# Patient Record
Sex: Female | Born: 1980 | Race: White | Hispanic: No | Marital: Married | Smoking: Former smoker
Health system: Southern US, Community
[De-identification: ages and names within clinical notes are randomized; demographics above are authoritative.]

## PROBLEM LIST (undated history)

## (undated) DIAGNOSIS — B1920 Unspecified viral hepatitis C without hepatic coma: Secondary | ICD-10-CM

## (undated) DIAGNOSIS — E119 Type 2 diabetes mellitus without complications: Secondary | ICD-10-CM

## (undated) DIAGNOSIS — E079 Disorder of thyroid, unspecified: Secondary | ICD-10-CM

## (undated) HISTORY — PX: TUBAL LIGATION: SHX77

---

## 2014-08-03 ENCOUNTER — Emergency Department: Payer: Self-pay | Admitting: Internal Medicine

## 2015-01-16 ENCOUNTER — Emergency Department: Payer: Self-pay | Admitting: Emergency Medicine

## 2015-07-27 ENCOUNTER — Encounter: Payer: Self-pay | Admitting: Emergency Medicine

## 2015-07-27 ENCOUNTER — Emergency Department
Admission: EM | Admit: 2015-07-27 | Discharge: 2015-07-28 | Discharge status: Against Medical Advice | Payer: Self-pay | Attending: Student | Admitting: Student

## 2015-07-27 DIAGNOSIS — Y9289 Other specified places as the place of occurrence of the external cause: Secondary | ICD-10-CM | POA: Insufficient documentation

## 2015-07-27 DIAGNOSIS — T465X2A Poisoning by other antihypertensive drugs, intentional self-harm, initial encounter: Secondary | ICD-10-CM | POA: Insufficient documentation

## 2015-07-27 DIAGNOSIS — Y9389 Activity, other specified: Secondary | ICD-10-CM | POA: Insufficient documentation

## 2015-07-27 DIAGNOSIS — E119 Type 2 diabetes mellitus without complications: Secondary | ICD-10-CM | POA: Insufficient documentation

## 2015-07-27 DIAGNOSIS — F322 Major depressive disorder, single episode, severe without psychotic features: Secondary | ICD-10-CM

## 2015-07-27 DIAGNOSIS — Y998 Other external cause status: Secondary | ICD-10-CM | POA: Insufficient documentation

## 2015-07-27 DIAGNOSIS — Z87891 Personal history of nicotine dependence: Secondary | ICD-10-CM | POA: Insufficient documentation

## 2015-07-27 HISTORY — DX: Unspecified viral hepatitis C without hepatic coma: B19.20

## 2015-07-27 HISTORY — DX: Disorder of thyroid, unspecified: E07.9

## 2015-07-27 HISTORY — DX: Type 2 diabetes mellitus without complications: E11.9

## 2015-07-27 LAB — COMPREHENSIVE METABOLIC PANEL
ALBUMIN: 3.9 g/dL (ref 3.5–5.0)
ALK PHOS: 48 U/L (ref 38–126)
ALT: 58 U/L — AB (ref 14–54)
AST: 67 U/L — AB (ref 15–41)
Anion gap: 7 (ref 5–15)
BILIRUBIN TOTAL: 0.6 mg/dL (ref 0.3–1.2)
BUN: 6 mg/dL (ref 6–20)
CO2: 26 mmol/L (ref 22–32)
CREATININE: 0.59 mg/dL (ref 0.44–1.00)
Calcium: 9.5 mg/dL (ref 8.9–10.3)
Chloride: 102 mmol/L (ref 101–111)
GFR calc Af Amer: >60 mL/min (ref >60)
GLUCOSE: 273 mg/dL — AB (ref 65–99)
Potassium: 4.1 mmol/L (ref 3.5–5.1)
Sodium: 135 mmol/L (ref 135–145)
TOTAL PROTEIN: 8.4 g/dL — AB (ref 6.5–8.1)

## 2015-07-27 LAB — CBC
HEMATOCRIT: 36.1 % (ref 35.0–47.0)
Hemoglobin: 11.8 g/dL — ABNORMAL LOW (ref 12.0–16.0)
MCH: 28.2 pg (ref 26.0–34.0)
MCHC: 32.7 g/dL (ref 32.0–36.0)
MCV: 86.2 fL (ref 80.0–100.0)
PLATELETS: 246 10*3/uL (ref 150–440)
RBC: 4.19 MIL/uL (ref 3.80–5.20)
RDW: 15.7 % — AB (ref 11.5–14.5)
WBC: 11.1 10*3/uL — ABNORMAL HIGH (ref 3.6–11.0)

## 2015-07-27 LAB — SALICYLATE LEVEL: Salicylate Lvl: <4 mg/dL (ref 2.8–30.0)

## 2015-07-27 LAB — T4, FREE: Free T4: 0.76 ng/dL (ref 0.61–1.12)

## 2015-07-27 LAB — ETHANOL

## 2015-07-27 LAB — ACETAMINOPHEN LEVEL: Acetaminophen (Tylenol), Serum: <10 ug/mL — ABNORMAL LOW (ref 10–30)

## 2015-07-27 LAB — TSH: TSH: 21.755 u[IU]/mL — ABNORMAL HIGH (ref 0.350–4.500)

## 2015-07-27 MED ORDER — GLIPIZIDE ER 10 MG PO TB24: 10.0000 mg | ORAL_TABLET | Freq: Every day | ORAL | Status: DC

## 2015-07-27 MED ORDER — LEVOTHYROXINE SODIUM 25 MCG PO TABS: 25.0000 ug | ORAL_TABLET | Freq: Every day | ORAL | Status: DC

## 2015-07-27 NOTE — ED Notes (Signed)
Patient awake and wants to speak to her "F**king kids." Explained to patient she is unable to use the phones at this time and that she is currently under IVC commitment (wants to sign herself out). Patient pulling medical equipment off as she is cussing about her IVC status and that "we IVC'd her cousin but she still killed herself." Patient redirected to herself and her surroundings. Will notify MD of patient's wishes to sign herself out.

## 2015-07-27 NOTE — ED Notes (Signed)
Patient ambulatory to the bathroom with assistance

## 2015-07-27 NOTE — ED Notes (Signed)
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
## 2015-07-27 NOTE — ED Notes (Signed)
Pt family left pt bedside

## 2015-07-27 NOTE — ED Notes (Signed)
Family at bedside. 
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
## 2015-07-27 NOTE — ED Provider Notes (Addendum)
   Report of charge, the patient intentionally attempted overdose on clonidine today. The patient was evaluated at triage and she is attempting to leave, based on a clear report of suicidal ideation and attempt per the charge nurse I have filed for commitment papers for the patient's safety.  Sharyn Creamer, MD 07/27/15 1350  Sharyn Creamer, MD 07/27/15 1352
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
## 2015-07-27 NOTE — ED Notes (Signed)
Counselor at pt bedside

## 2015-07-27 NOTE — ED Notes (Signed)
BEHAVIORAL HEALTH ROUNDING Patient sleeping: Yes.   Patient alert and oriented: yes Behavior appropriate: Yes.  ; If no, describe:  Nutrition and fluids offered: {YES Toileting and hygiene offered: {No Sitter present: {YES Law enforcement present: {YES

## 2015-07-27 NOTE — ED Notes (Signed)
Pt in via [facility] with c/o overdose. EMS reports per pt, she took 20 0.1mg  Clonodine tablets that were not prescribed to her. Pt reports is having suicidal thoughts. Pt reports she took a total of  of the tablets. Pt states, "I was just trying to sleep my life away".

## 2015-07-27 NOTE — ED Notes (Signed)
Pt denies SI currently. Pt reports taking 20, 0.1 mg clonidine

## 2015-07-28 ENCOUNTER — Inpatient Hospital Stay
Admission: RE | Admit: 2015-07-28 | Discharge: 2015-07-30 | DRG: 885 | Disposition: A | Discharge status: Home / Self Care | Payer: No Typology Code available for payment source | Source: Intra-hospital | Attending: Psychiatry | Admitting: Psychiatry

## 2015-07-28 DIAGNOSIS — F1220 Cannabis dependence, uncomplicated: Secondary | ICD-10-CM | POA: Diagnosis present

## 2015-07-28 DIAGNOSIS — E119 Type 2 diabetes mellitus without complications: Secondary | ICD-10-CM

## 2015-07-28 DIAGNOSIS — Z62810 Personal history of physical and sexual abuse in childhood: Secondary | ICD-10-CM | POA: Diagnosis present

## 2015-07-28 DIAGNOSIS — F1190 Opioid use, unspecified, uncomplicated: Secondary | ICD-10-CM | POA: Diagnosis present

## 2015-07-28 DIAGNOSIS — T50902A Poisoning by unspecified drugs, medicaments and biological substances, intentional self-harm, initial encounter: Secondary | ICD-10-CM | POA: Diagnosis present

## 2015-07-28 DIAGNOSIS — B1920 Unspecified viral hepatitis C without hepatic coma: Secondary | ICD-10-CM

## 2015-07-28 DIAGNOSIS — Z818 Family history of other mental and behavioral disorders: Secondary | ICD-10-CM | POA: Diagnosis not present

## 2015-07-28 DIAGNOSIS — F1121 Opioid dependence, in remission: Secondary | ICD-10-CM

## 2015-07-28 DIAGNOSIS — F322 Major depressive disorder, single episode, severe without psychotic features: Principal | ICD-10-CM

## 2015-07-28 DIAGNOSIS — E559 Vitamin D deficiency, unspecified: Secondary | ICD-10-CM | POA: Diagnosis present

## 2015-07-28 DIAGNOSIS — Z87891 Personal history of nicotine dependence: Secondary | ICD-10-CM

## 2015-07-28 DIAGNOSIS — F1120 Opioid dependence, uncomplicated: Secondary | ICD-10-CM | POA: Diagnosis present

## 2015-07-28 DIAGNOSIS — G4700 Insomnia, unspecified: Secondary | ICD-10-CM | POA: Diagnosis present

## 2015-07-28 DIAGNOSIS — Z79899 Other long term (current) drug therapy: Secondary | ICD-10-CM | POA: Diagnosis not present

## 2015-07-28 DIAGNOSIS — E039 Hypothyroidism, unspecified: Secondary | ICD-10-CM | POA: Diagnosis present

## 2015-07-28 LAB — GLUCOSE, CAPILLARY
GLUCOSE-CAPILLARY: 285 mg/dL — AB (ref 65–99)
GLUCOSE-CAPILLARY: 248 mg/dL — AB (ref 65–99)
Glucose-Capillary: 289 mg/dL — ABNORMAL HIGH (ref 65–99)

## 2015-07-28 LAB — HEMOGLOBIN A1C: HEMOGLOBIN A1C: 8.8 % — AB (ref 4.0–6.0)

## 2015-07-28 MED ORDER — IBUPROFEN 600 MG PO TABS
600.0000 mg | ORAL_TABLET | Freq: Four times a day (QID) | ORAL | Status: DC | PRN
  Administered 2015-07-28 – 2015-07-29 (×2): 600 mg via ORAL
  Filled 2015-07-28 (×2): qty 1

## 2015-07-28 MED ORDER — ALUM & MAG HYDROXIDE-SIMETH 200-200-20 MG/5ML PO SUSP: 30.0000 mL | ORAL | Status: DC | PRN

## 2015-07-28 MED ORDER — VENLAFAXINE HCL ER 37.5 MG PO CP24: 37.5000 mg | ORAL_CAPSULE | Freq: Every day | ORAL | Status: DC

## 2015-07-28 MED ORDER — METFORMIN HCL 500 MG PO TABS
500.0000 mg | ORAL_TABLET | Freq: Two times a day (BID) | ORAL | Status: DC
  Administered 2015-07-28 – 2015-07-30 (×4): 500 mg via ORAL
  Filled 2015-07-28 (×4): qty 1

## 2015-07-28 MED ORDER — ACETAMINOPHEN 325 MG PO TABS: 650.0000 mg | ORAL_TABLET | Freq: Four times a day (QID) | ORAL | Status: DC | PRN

## 2015-07-28 MED ORDER — HYDROXYZINE HCL 50 MG PO TABS: 50.0000 mg | ORAL_TABLET | Freq: Three times a day (TID) | ORAL | Status: DC | PRN

## 2015-07-28 MED ORDER — INSULIN ASPART 100 UNIT/ML ~~LOC~~ SOLN
0.0000 [IU] | Freq: Three times a day (TID) | SUBCUTANEOUS | Status: DC
  Administered 2015-07-29 (×2): 1 [IU] via SUBCUTANEOUS
  Administered 2015-07-29: 2 [IU] via SUBCUTANEOUS
  Filled 2015-07-28 (×2): qty 1
  Filled 2015-07-28 (×2): qty 2

## 2015-07-28 MED ORDER — VENLAFAXINE HCL ER 37.5 MG PO CP24
37.5000 mg | ORAL_CAPSULE | Freq: Every day | ORAL | Status: DC
  Administered 2015-07-28 – 2015-07-30 (×3): 37.5 mg via ORAL
  Filled 2015-07-28 (×3): qty 1

## 2015-07-28 MED ORDER — MAGNESIUM HYDROXIDE 400 MG/5ML PO SUSP: 30.0000 mL | Freq: Every day | ORAL | Status: DC | PRN

## 2015-07-28 MED ORDER — ONDANSETRON HCL 4 MG/2ML IJ SOLN
INTRAMUSCULAR | Status: AC
  Filled 2015-07-28: qty 2

## 2015-07-28 MED ORDER — LEVOTHYROXINE SODIUM 50 MCG PO TABS
25.0000 ug | ORAL_TABLET | Freq: Every day | ORAL | Status: DC
  Administered 2015-07-28 – 2015-07-30 (×3): 25 ug via ORAL
  Filled 2015-07-28 (×4): qty 1

## 2015-07-28 MED ORDER — LEVOTHYROXINE SODIUM 50 MCG PO TABS: 25.0000 ug | ORAL_TABLET | Freq: Every day | ORAL | Status: DC

## 2015-07-28 MED ORDER — VITAMIN D 1000 UNITS PO TABS
2000.0000 [IU] | ORAL_TABLET | Freq: Every day | ORAL | Status: DC
  Administered 2015-07-28 – 2015-07-30 (×3): 2000 [IU] via ORAL
  Filled 2015-07-28 (×3): qty 2

## 2015-07-28 MED ORDER — TRAZODONE HCL 100 MG PO TABS: 100.0000 mg | ORAL_TABLET | Freq: Every evening | ORAL | Status: DC | PRN

## 2015-07-28 MED ORDER — GLIPIZIDE ER 5 MG PO TB24
10.0000 mg | ORAL_TABLET | Freq: Every day | ORAL | Status: DC
  Administered 2015-07-28 – 2015-07-30 (×3): 10 mg via ORAL
  Filled 2015-07-28 (×3): qty 2

## 2015-07-28 MED ORDER — VITAMIN D3 50 MCG (2000 UT) PO CAPS: 1.0000 | ORAL_CAPSULE | Freq: Every day | ORAL | Status: DC

## 2015-07-28 MED ORDER — MORPHINE SULFATE (PF) 4 MG/ML IV SOLN
INTRAVENOUS | Status: AC
  Filled 2015-07-28: qty 1

## 2015-07-28 NOTE — Progress Notes (Signed)
Recreation Therapy Notes  Date: 09.27.16 Time: 3:00 pm Location: Craft Room  Group Topic: Goal Setting  Goal Area(s) Addresses:  Patient will write at least one goal. Patient will write at least one obstacle.  Behavioral Response: Did not attend  Intervention: Recovery Goal Chart  Activity: Patients were instructed to make a goal chart listing goals, obstacles, the date they started working on their goals, and the date they have achieved their goals.   Education: LRT educated patients on healthy ways to celebrate reaching their goals.  Education Outcome: Patient did not attend group.  Clinical Observations/Feedback: Patient did not attend group.   Greene,Elizabeth M, LRT/CTRS 07/28/2015 4:19 PM 

## 2015-07-28 NOTE — Tx Team (Signed)
Initial Interdisciplinary Treatment Plan   PATIENT STRESSORS: Loss of Cousin to Suicide Traumatic event   PATIENT STRENGTHS: Ability for insight Average or above average intelligence General fund of knowledge Motivation for treatment/growth Religious Affiliation Supportive family/friends   PROBLEM LIST: Problem List/Patient Goals Date to be addressed Date deferred Reason deferred Estimated date of resolution  Depression 07/28/15     Suicidal 07/28/15                                                DISCHARGE CRITERIA:  Improved stabilization in mood, thinking, and/or behavior  PRELIMINARY DISCHARGE PLAN: Outpatient therapy  PATIENT/FAMIILY INVOLVEMENT: This treatment plan has been presented to and reviewed with the patient, Stephanie Barry, and/or family member.  The patient and family have been given the opportunity to ask questions and make suggestions.  Gretta Arab [facility] 07/28/2015, 3:40 AM

## 2015-07-28 NOTE — Progress Notes (Signed)
Patient cooperative with morning assessment. Denies suicidal ideation and agreed to contract for safety. Denies hopelessness. Patient reported stressors to include family (aunt, parents), suicide of cousin, husband away for work and 5 children. Reported the need to find a new place to live because she currently lives with her children in her parent's home. Patient did not go to dayroom for meals and stated she "feels the need to sleep."  She did not have a goal for the day and did not want to attend groups at this time. Writer encouraged participation in unit programming.

## 2015-07-28 NOTE — [facility] Group Notes (Signed)
[facility] Group Notes:  (Nursing/MHT/Case Management/Adjunct)  Date:  07/28/2015  Time:  2:22 PM  Type of Therapy:  Psychoeducational Skills  Participation Level:  Did Not Attend   Lynelle Smoke [facility] 07/28/2015, 2:22 PM

## 2015-07-28 NOTE — ED Notes (Signed)
Patient sitting in floor, asleep with her head against the wall. MD notified of patient behavior.

## 2015-07-28 NOTE — ED Notes (Signed)
BEHAVIORAL HEALTH ROUNDING Patient sleeping: No. Patient alert and oriented: yes Behavior appropriate: Yes.  ; If no, describe:  Nutrition and fluids offered: {No  Toileting and hygiene offered: {YES Sitter present: {YES Law enforcement present: {YES

## 2015-07-28 NOTE — Progress Notes (Signed)
Recreation Therapy Notes  INPATIENT RECREATION THERAPY ASSESSMENT  Patient Details Name: Stephanie Barry MRN: [id number] DOB: Jan 09, 1981 Today's Date: 07/28/2015  Patient Stressors: Family, Death, Other (Comment) (Mom yells; cousin died; kids not listening)  Coping Skills:   Isolate, Substance Abuse, Avoidance, Music, Sports  Personal Challenges: Anger, Communication, Self-Esteem/Confidence, Social Interaction, Time Management, Trusting Others  Leisure Interests (2+):  Individual - Other (Comment) (Play with kids, hang out with mom)  Awareness of Community Resources:  No  Community Resources:     Current Use:    If no, Barriers?:    Patient Strengths:  Good heart, kind  Patient Identified Areas of Improvement:  Self-esteem  Current Recreation Participation:  Nothing  Patient Goal for Hospitalization:  To get better for her kids  [place] of Residence:  [place] of Residence:  [place]   Current SI (including self-harm):  No  Current HI:  No  Consent to Intern Participation: N/A   Jacquelynn Cree, LRT/CTRS 07/28/2015, 6:11 PM

## 2015-07-28 NOTE — ED Notes (Signed)
BEHAVIORAL HEALTH ROUNDING Patient sleeping: Yes.   Patient alert and oriented: yes Behavior appropriate: Yes.  ; If no, describe:  Nutrition and fluids offered: No Toileting and hygiene offered: {N Sitter present: {YES Law enforcement present: {YES

## 2015-07-28 NOTE — ED Notes (Signed)
Patient continues to sleep while sitting on the floor.  Patient does not wish to get in the bed.

## 2015-07-28 NOTE — ED Notes (Signed)
Patient now asleep in the bed. Will continue to monitor
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
## 2015-07-28 NOTE — ED Notes (Signed)
Will seek order to move to ED BHU.

## 2015-07-28 NOTE — ED Notes (Signed)
Patient will orally hydrate per MD. Patient aware.
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
## 2015-07-28 NOTE — Plan of Care (Signed)
Problem: Alteration in mood Goal: STG-Patient is able to discuss feelings and issues (Patient is able to discuss feelings and issues leading to depression)  Outcome: Progressing Patient able to identify stressors and contemplating change.
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
## 2015-07-28 NOTE — ED Notes (Signed)
When patient arrived at the [facility] in [place] she began to "feel faint" and proceeded to sit down in the floor. Brought back over to room 20. Will check FSBS and notify MD

## 2015-07-28 NOTE — Progress Notes (Signed)
Patient had depressed, sad mood. Became teary eyed when speaking about missing her children. Patient ate lunch and dinner in the dayroom. She not attend any group meetings and stated she "wasnt ready yet."  Patient remains pleasant and cooperative. Complaint with medications.

## 2015-07-29 LAB — THYROID PANEL WITH TSH
Free Thyroxine Index: 2.3 (ref 1.2–4.9)
T3 Uptake Ratio: 23 % — ABNORMAL LOW (ref 24–39)
T4, Total: 10.1 ug/dL (ref 4.5–12.0)
TSH: 8.15 u[IU]/mL — ABNORMAL HIGH (ref 0.450–4.500)

## 2015-07-29 LAB — GLUCOSE, CAPILLARY
GLUCOSE-CAPILLARY: 179 mg/dL — AB (ref 65–99)
Glucose-Capillary: 126 mg/dL — ABNORMAL HIGH (ref 65–99)
Glucose-Capillary: 132 mg/dL — ABNORMAL HIGH (ref 65–99)

## 2015-07-29 MED ORDER — TRAZODONE HCL 100 MG PO TABS: 100.0000 mg | ORAL_TABLET | Freq: Every evening | ORAL | Status: DC | PRN

## 2015-07-29 MED ORDER — METFORMIN HCL 500 MG PO TABS: 500.0000 mg | ORAL_TABLET | Freq: Two times a day (BID) | ORAL | Status: DC

## 2015-07-29 MED ORDER — VENLAFAXINE HCL ER 75 MG PO CP24: 75.0000 mg | ORAL_CAPSULE | Freq: Every day | ORAL | Status: DC

## 2015-07-29 NOTE — [facility] Group Notes (Signed)
[facility] Group Notes:  (Nursing/MHT/Case Management/Adjunct)  Date:  07/29/2015  Time:  1:45 PM  Type of Therapy:  Psychoeducational Skills  Participation Level:  Active  Participation Quality:  Appropriate and Supportive  Affect:  Appropriate  Cognitive:  Appropriate  Insight:  Appropriate  Engagement in Group:  Supportive  Modes of Intervention:  Clarification  Summary of Progress/Problems:  Stephanie Barry 07/29/2015, 1:45 PM

## 2015-07-29 NOTE — Progress Notes (Signed)
Recreation Therapy Notes  Date: 09.28.16 Time: 3:00 pm Location: Day Room  Group Topic: Self-esteem  Goal Area(s) Addresses:  Patient will write at least one positive trait. Patient will verbalize benefit of having a good self-esteem.  Behavioral Response: Attentive, Interactive  Intervention: I Am  Activity: Patients were given a worksheet with the letter I on it and instructed to fill the letter with positive traits about themselves.  Education:LRT educated patients on ways they can increase their self-esteem.  Education Outcome: Acknowledges education/In group clarification offered   Clinical Observations/Feedback: Patient completed activity by writing positive traits down. Patient contributed to group discussion by stating it was difficult to think of positive traits, how it felt to see positive traits written out, and how her self-esteem affects her.  Jacquelynn Cree, LRT/CTRS 07/29/2015 4:46 PM

## 2015-07-29 NOTE — Plan of Care (Signed)
Problem: [facility] Participation in Recreation Therapeutic Interventions Goal: STG-Patient will demonstrate improved self esteem by identif STG: Self-Esteem - Within 3 treatment sessions, patient will verbalize at least 5 positive affirmation statements in one treatment session to increase self-esteem post d/c.  Outcome: Completed/Met Date Met:  07/29/15 Treatment Session 1; Completed 1 out of 1: At approximately 12:20 pm, LRT met with patient in craft room. Patient verbalized 5 positive affirmation statements. Patient reported it felt "pretty good". LRT encouraged patient to continue saying positive affirmation statements.  Leonette Monarch, LRT/CTRS 09.28.16 1:42 pm Goal: STG-Other Recreation Therapy Goal (Specify) STG: Time Management - Within 3 treatment sessions, patient will verbalize understanding of scheduling in one treatment session to increase time management skills post d/c.  Outcome: Completed/Met Date Met:  07/29/15 Treatment Session 1; Completed 1 out of 1: At approximately 12:20 pm, LRT met with patient in craft room. LRT provided patient with blank schedules. LRT educated patient on scheduling. Patient verbalized understanding of scheduling. LRT encouraged patient to use schedules to help her with time management.  Leonette Monarch, LRT/CTRS 09.28.16 1:45 pm

## 2015-07-29 NOTE — Progress Notes (Signed)
[facility] MD Progress Note  07/29/2015 10:51 AM Stephanie Barry  MRN:  [id number] Subjective: Patient reports feeling much improved. She feels less depressed, more hopeful and future oriented. She denies problems with the medications that were started yesterday. She denies having any physical complaints. Denies having major problems with sleep, appetite energy or concentration. Denies SI, HI hopelessness, helplessness or worthlessness. Denies psychosis or hallucinations.  Patient has been attending group and has been compliant with treatment. Patient states she is looking forward to be discharged in order to return to take care of her children. Patient tells me once discharged she will plan to return to live with her mother. However she will apply for low income housing and move out as soon as possible. She is states that her aunt is no longer trying to evict her.  Per nursing: Patient cooperative with morning assessment. Denies suicidal ideation and agreed to contract for safety. Denies hopelessness. Patient reported stressors to include family (aunt, parents), suicide of cousin, husband away for work and 5 children. Reported the need to find a new place to live because she currently lives with her children in her parent's home. Patient did not go to dayroom for meals and stated she "feels the need to sleep." She did not have a goal for the day and did not want to attend groups at this time. Writer encouraged participation in unit programming.   Principal Problem: Major depressive disorder, single episode, severe without psychotic features Diagnosis:   Patient Active Problem List   Diagnosis Date Noted  . Hypothyroidism [E03.9] 07/28/2015  . Cannabis use disorder, moderate, dependence [F12.20] 07/28/2015  . Diabetes [E11.9] 07/28/2015  . Hepatitis C [B19.20] 07/28/2015  . Major depressive disorder, single episode, severe without psychotic features [F32.2] 07/28/2015  . Opioid use disorder, severe, in early  remission [F11.90] 07/28/2015   Total Time spent with patient: 30 minutes  Past Psychiatric History: Past psychiatric history is positive for depression but no suicidal behavior in the past. No previous psychiatric hospitalizations. She is currently taking medicine prescribed by an outpatient provider at [facility]. No history of psychotic symptoms. Patient received Suboxone from a physician in [place]. She weaned herself off the Suboxone several months ago.  Risk to Self: Is patient at risk for suicide?: Yes Risk to Others:   Prior Inpatient Therapy:   Prior Outpatient Therapy:    Alcohol Screening: 1. How often do you have a drink containing alcohol?: Never 9. Have you or someone else been injured as a result of your drinking?: No 10. Has a relative or friend or a doctor or another health worker been concerned about your drinking or suggested you cut down?: No Alcohol Use Disorder Identification Test Final Score (AUDIT): 0 Brief Intervention: AUDIT score less than 7 or less-screening does not suggest unhealthy drinking-brief intervention not indicated   Past Medical History: Multiple medical problems including diabetes, hypothyroidism and hepatitis C and anemia Past Medical History  Diagnosis Date  . Diabetes mellitus without complication   . Thyroid disease   . Hepatitis C    History reviewed. No pertinent past surgical history.  Family History: History reviewed. No pertinent family history.   Family Psychiatric History: Positive for a cousin who committed suicide and several other family members with depression. 2 of her children suffer from ADHD.  Social History: Lives with her mother and also her own 5 children (younguest is almost 2 and the oldest is 9. She works as a [employer] fast streamer. Irregular hours. Her fianc  lives in another city but is probably going to move here soon. Patient is originally from [place] that she has been living in [place] since  January. History  Alcohol Use No    History  Drug Use  . Yes  . Special: Marijuana    Social History   Social History  . Marital Status: Single    Spouse Name: N/A  . Number of Children: N/A  . Years of Education: N/A   Social History Main Topics  . Smoking status: Former Research scientist (life sciences)  . Smokeless tobacco: None  . Alcohol Use: No  . Drug Use: Yes    Special: Marijuana  . Sexual Activity: Not Asked        Sleep: Good  Appetite:  Good  Current Medications: Current Facility-Administered Medications  Medication Dose Route Frequency Provider Last Rate Last Dose  . acetaminophen (TYLENOL) tablet 650 mg  650 mg Oral Q6H PRN Gonzella Lex, MD      . alum & mag hydroxide-simeth (MAALOX/MYLANTA) 200-200-20 MG/5ML suspension 30 mL  30 mL Oral Q4H PRN Gonzella Lex, MD      . cholecalciferol (VITAMIN D) tablet 2,000 Units  2,000 Units Oral Daily Hildred Priest, MD   2,000 Units at 07/29/15 0901  . glipiZIDE (GLUCOTROL XL) 24 hr tablet 10 mg  10 mg Oral Q breakfast Gonzella Lex, MD   10 mg at 07/29/15 0901  . hydrOXYzine (ATARAX/VISTARIL) tablet 50 mg  50 mg Oral TID PRN Hildred Priest, MD      . ibuprofen (ADVIL,MOTRIN) tablet 600 mg  600 mg Oral Q6H PRN Hildred Priest, MD   600 mg at 07/29/15 0955  . insulin aspart (novoLOG) injection 0-9 Units  0-9 Units Subcutaneous TID WC Hildred Priest, MD   2 Units at 07/29/15 [phone]  . levothyroxine (SYNTHROID, LEVOTHROID) tablet 25 mcg  25 mcg Oral QAC breakfast Gonzella Lex, MD   25 mcg at 07/29/15 0729  . magnesium hydroxide (MILK OF MAGNESIA) suspension 30 mL  30 mL Oral Daily PRN Gonzella Lex, MD      . metFORMIN (GLUCOPHAGE) tablet 500 mg  500 mg Oral BID WC Hildred Priest, MD   500 mg at 07/29/15 0901  . traZODone (DESYREL) tablet 100 mg  100 mg Oral QHS PRN Hildred Priest, MD      . venlafaxine XR (EFFEXOR-XR) 24 hr  capsule 37.5 mg  37.5 mg Oral Q breakfast Hildred Priest, MD   37.5 mg at 07/29/15 0901    Lab Results:  Results for orders placed or performed during the hospital encounter of 07/28/15 (from the past 48 hour(s))  Glucose, capillary     Status: Abnormal   Collection Time: 07/28/15  6:52 AM  Result Value Ref Range   Glucose-Capillary 285 (H) 65 - 99 mg/dL   Comment 1 Notify RN    Comment 2 Document in Chart   Thyroid Panel With TSH     Status: Abnormal   Collection Time: 07/28/15 11:57 AM  Result Value Ref Range   TSH 8.150 (H) 0.450 - 4.500 uIU/mL   T4, Total 10.1 4.5 - 12.0 ug/dL   T3 Uptake Ratio 23 (L) 24 - 39 %   Free Thyroxine Index 2.3 1.2 - 4.9    Comment: (NOTE) Performed At: [facility] [address] [place], [place] [id number] Lindon Romp MD DJ:[id number]   Hemoglobin A1c     Status: Abnormal   Collection Time: 07/28/15 12:03 PM  Result Value  Ref Range   Hgb A1c MFr Bld 8.8 (H) 4.0 - 6.0 %  Glucose, capillary     Status: Abnormal   Collection Time: 07/28/15  4:47 PM  Result Value Ref Range   Glucose-Capillary 248 (H) 65 - 99 mg/dL   Comment 1 Notify RN   Glucose, capillary     Status: Abnormal   Collection Time: 07/29/15  6:48 AM  Result Value Ref Range   Glucose-Capillary 179 (H) 65 - 99 mg/dL    Physical Findings: AIMS: Facial and Oral Movements Muscles of Facial Expression: None, normal Lips and Perioral Area: None, normal Jaw: None, normal Tongue: None, normal,Extremity Movements Upper (arms, wrists, hands, fingers): None, normal Lower (legs, knees, ankles, toes): None, normal, Trunk Movements Neck, shoulders, hips: None, normal, Overall Severity Severity of abnormal movements (highest score from questions above): None, normal Incapacitation due to abnormal movements: None, normal Patient's awareness of abnormal movements (rate only patient's report): No Awareness, Dental Status Current problems with teeth and/or dentures?:  No Does patient usually wear dentures?: No  CIWA:    COWS:     Musculoskeletal: Strength & Muscle Tone: within normal limits Gait & Station: normal Patient leans: N/A  Psychiatric Specialty Exam: Review of Systems  Constitutional: Negative.   HENT: Negative.   Eyes: Negative.   Respiratory: Negative.   Cardiovascular: Negative.   Gastrointestinal: Negative.   Genitourinary: Negative.   Musculoskeletal: Negative.   Skin: Negative.   Neurological: Negative.   Endo/Heme/Allergies: Negative.   Psychiatric/Behavioral: Positive for depression. Negative for suicidal ideas, hallucinations, memory loss and substance abuse. The patient is not nervous/anxious and does not have insomnia.     Blood pressure 101/69, pulse 66, temperature 97.5 F (36.4 C), temperature source Oral, resp. rate 20, height _0  (1.702 m), weight 100.245 kg (221 lb), last menstrual period 07/13/2015, SpO2 100 %.Body mass index is 34.61 kg/(m^2).  General Appearance: Fairly Groomed  Engineer, water::  Good  Speech:  Clear and Coherent  Volume:  Normal  Mood:  Euthymic  Affect:  Congruent  Thought Process:  Linear  Orientation:  Full (Time, Place, and Person)  Thought Content:  Hallucinations: None  Suicidal Thoughts:  No  Homicidal Thoughts:  No  Memory:  Immediate;   Good Recent;   Good Remote;   Good  Judgement:  Fair  Insight:  Fair  Psychomotor Activity:  Normal  Concentration:  NA  Recall:  NA  Fund of Knowledge:Good  Language: Good  Akathisia:  No  Handed:    AIMS (if indicated):     Assets:  Communication Skills Desire for Improvement Financial Resources/Insurance Housing Social Support  ADL's:  Intact  Cognition: WNL  Sleep:  Number of Hours: 6.5   Treatment Plan Summary: Daily contact with patient to assess and evaluate symptoms and progress in treatment and Medication management   34 year old Caucasian female with history of depression and opiate dependence presents to the emergency  department yesterday after she overdosed on clonidine.   Major depressive disorder: Patient states that prior to admission she was prescribed with fluoxetine 40 mg which she has been taking. She feels that the medication has not helped much. Patient agrees with a trial of Effexor. Has never tried any other medications in the past. Patient has been is started on Effexor XR 37.5 mg a day.  Insomnia: I will start the patient on trazodone 100 mg by mouth daily at bedtime when necessary  Opiate dependence: Independent remission since July. Prior to July she was  sober for 2 years. She received Subutex for about 2 years.  Diabetes: continue Glucotrol 10 mg by mouth daily. Diet continue low-carb diet. FSBG will be checked once q day. Hemoglobin A1c is 8. Patient has been started on metformin 500 mg by mouth twice a day  Hypothyroidism: Continue Synthroid 25 g daily. Recheck TSH is now improved but still elevated at 8. T3 and T4 are basically within normal limits  Vitamin D deficiency: Continue vitamin D 2000 units  Discharge plan: Once stable the patient will be discharged back to her home. I will plan to refer her to  [facility] for  psychotherapy and medication management.  Patient met with peers support from [place] today.  Hildred Priest 07/29/2015, 10:51 AM

## 2015-07-29 NOTE — Progress Notes (Signed)
D: Patient denies SI/HI/AVH.  Patient affect is appropriate and her mood is depressed. Patient did attend evening group. Patient visible on the milieu. No distress noted. A: Support and encouragement offered. Scheduled medications given to pt. Q 15 min checks continued for patient safety. R: Patient receptive. Patient remains safe on the unit.   

## 2015-07-29 NOTE — Progress Notes (Signed)
She stated that she feels better.She states "I was raised by two strong women,my mother my grandmother,so I could be strong.Denies depression & suicidal ideation.Appropriate with staff & peers.Medication & group compliant.

## 2015-07-29 NOTE — Plan of Care (Signed)
Problem: Alteration in mood Goal: LTG-Pt's behavior demonstrates decreased signs of depression (Patient's behavior demonstrates decreased signs of depression to the point the patient is safe to return home and continue treatment in an outpatient setting)  Outcome: Progressing Denies depression & suicidal ideation.     

## 2015-07-29 NOTE — [facility] Group Notes (Signed)
[facility] LCSW Group Therapy  07/29/2015 3:48 PM  Type of Therapy:  Group Therapy  Participation Level:  Active  Participation Quality:  Appropriate and Attentive  Affect:  Appropriate  Cognitive:  Alert, Appropriate and Oriented  Insight:  Engaged  Engagement in Therapy:  Engaged  Modes of Intervention:  Discussion, Socialization and Support  Summary of Progress/Problems: Patient participated in group discussion and identified her support system is her husband and her children. Patient shared that she has struggled with loss and was able to identify with other group members who are experiencing grief.   Stephanie Barry, MSW, LCSWA 07/29/2015, 3:48 PM

## 2015-07-29 NOTE — Progress Notes (Signed)
Recreation Therapy Notes  INPATIENT RECREATION TR PLAN  Patient Details Name: Stephanie Barry MRN: [id number] DOB: 1981/09/16 Today's Date: 07/29/2015  Rec Therapy Plan Is patient appropriate for Therapeutic Recreation?: Yes Treatment times per week: At least once a week TR Treatment/Interventions: 1:1 session, Group participation (Comment) (Appropriate participation in daily recreation therapy tx)  Discharge Criteria Pt will be discharged from therapy if:: Treatment goals are met Treatment plan/goals/alternatives discussed and agreed upon by:: Patient/family  Discharge Summary Short term goals set: See Care Plans Short term goals met: Complete Progress toward goals comments: One-to-one attended Which groups?: Self-esteem One-to-one attended: Self-esteem, time management Reason goals not met: N/A Therapeutic equipment acquired: None Reason patient discharged from therapy: Treatment goals met Pt/family agrees with progress & goals achieved: Yes Date patient discharged from therapy: 07/29/15   Leonette Monarch, LRT/CTRS 07/29/2015, 5:05 PM

## 2015-07-30 LAB — GLUCOSE, CAPILLARY
GLUCOSE-CAPILLARY: 94 mg/dL (ref 65–99)
GLUCOSE-CAPILLARY: 106 mg/dL — AB (ref 65–99)

## 2015-07-30 NOTE — Progress Notes (Signed)
D: Patient denies SI/HI/AVH.  Patient affect is appropriate and her mood is depressed.  Patient did attend evening group. Patient visible on the milieu.  Patient interaction with staff and other patients is appropriate.  No distress noted. A: Support and encouragement offered. Scheduled medications given to pt. Q 15 min checks continued for patient safety. R: Patient receptive. Patient remains safe on the unit.

## 2015-07-30 NOTE — [facility] Suicide Risk Assessment (Signed)
[facility] Discharge Suicide Risk Assessment   Demographic Factors:  Caucasian  Total Time spent with patient: 30 minutes    Psychiatric Specialty Exam: Physical Exam  ROS  Have you used any form of tobacco in the last 30 days? (Cigarettes, Smokeless Tobacco, Cigars, and/or Pipes): No  Has this patient used any form of tobacco in the last 30 days? (Cigarettes, Smokeless Tobacco, Cigars, and/or Pipes) No  Mental Status Per Nursing Assessment::   On Admission:  Suicidal ideation indicated by patient  Current Mental Status by Physician: Calm, pleasant, cooperative. Mood euthymic, affect bright and reactive. Hopeful and future oriented. Excited about discharge. Denies feelings of hopelessness or helplessness. Postop for children under the age of 33.  Loss Factors: Loss of significant relationship and Decline in physical health  Historical Factors: Family history of mental illness or substance abuse and Impulsivity  Risk Reduction Factors:   Responsible for children under 23 years of age, Sense of responsibility to family, Living with another person, especially a relative and Positive social support  Continued Clinical Symptoms:  Depression:   Impulsivity Alcohol/Substance Abuse/Dependencies Medical Diagnoses and Treatments/Surgeries  Cognitive Features That Contribute To Risk:  None    Suicide Risk:  Minimal: No identifiable suicidal ideation.  Patients presenting with no risk factors but with morbid ruminations; may be classified as minimal risk based on the severity of the depressive symptoms  Principal Problem: Major depressive disorder, single episode, severe without psychotic features Discharge Diagnoses:  Patient Active Problem List   Diagnosis Date Noted  . Hypothyroidism [E03.9] 07/28/2015  . Cannabis use disorder, moderate, dependence [F12.20] 07/28/2015  . Diabetes [E11.9] 07/28/2015  . Hepatitis C [B19.20] 07/28/2015  . Major depressive disorder, single episode,  severe without psychotic features [F32.2] 07/28/2015  . Opioid use disorder, severe, in early remission [F11.90] 07/28/2015    Follow-up Information    Go to [facility].   Why:  Walk-ins Monday, Wednesday, Friday between 8am-3pm, Hospital Follow up, Outpatient Medication Management, Therapy, Referral for CST services   Contact information:   [address] [place] [address] [phone]       Is patient on multiple antipsychotic therapies at discharge:  No   Has Patient had three or more failed trials of antipsychotic monotherapy by history:  No  Recommended Plan for Multiple Antipsychotic Therapies: NA    Jimmy Footman 07/30/2015, 9:40 AM
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
## 2015-07-30 NOTE — Progress Notes (Signed)
Presents with a bright affect and big smile.  Verbalizes that she feels much better.  Denies SI or depression.  Able to verbalize different coping mechanisms.  Named  3 positive things about herself.  Further states that she realizes that she is important and is loved and knows that when things upset her she needs to remove herself from the situation and go for a walk so she can sort things out.  Discharge instructions given, verbalized understanding.  Prescriptions and crisis card given. Personal belongings returned.  Escorted off unit by a staff member to meet mother to travel home.

## 2015-07-30 NOTE — Progress Notes (Signed)
  [facility] Adult Case Management Discharge Plan :  Will you be returning to the same living situation after discharge:  Yes,  Home At discharge, do you have transportation home?: Yes,  Mother Do you have the ability to pay for your medications: Yes,  Insurance   Release of information consent forms completed and in the chart;  Patient's signature needed at discharge.  Patient to Follow up at: Follow-up Information    Follow up with [facility]. Go on 08/03/2015.   Why:  You have an appointment with Lorella Nimrod on Monday Oct 3rd at 7:00am. They also have walk-in hours Monday, Wednesday, Friday between 8am-3pm. Hospital Follow up, Outpatient Medication Management, Therapy, Referral for CST services   Contact information:   [address] [place] [address] [phone]      Patient denies SI/HI: Yes,  Yes    Safety Planning and Suicide Prevention discussed: Yes,  With patient and mother   Have you used any form of tobacco in the last 30 days? (Cigarettes, Smokeless Tobacco, Cigars, and/or Pipes): No  Has patient been referred to the Quitline?: N/A patient is not a smoker  [employer] MSW, LCSWA  07/30/2015, 12:58 PM

## 2015-07-30 NOTE — [facility] Suicide Risk Assessment (Signed)
[facility] INPATIENT:  Family/Significant Other Suicide Prevention Education  Suicide Prevention Education:  Education Completed;Donna Alfonse Ras (mother) [phone] has been identified by the patient as the family member/significant other with whom the patient will be residing, and identified as the person(s) who will aid the patient in the event of a mental health crisis (suicidal ideations/suicide attempt).  With written consent from the patient, the family member/significant other has been provided the following suicide prevention education, prior to the and/or following the discharge of the patient.  The suicide prevention education provided includes the following:  Suicide risk factors  Suicide prevention and interventions  National Suicide Hotline telephone number  [facility] assessment telephone number  [facility] Emergency Assistance 911  [facility] and/or Residential Mobile Crisis Unit telephone number  Request made of family/significant other to:  Remove weapons (e.g., guns, rifles, knives), all items previously/currently identified as safety concern.    Remove drugs/medications (over-the-counter, prescriptions, illicit drugs), all items previously/currently identified as a safety concern.  The family member/significant other verbalizes understanding of the suicide prevention education information provided.  The family member/significant other agrees to remove the items of safety concern listed above.  Candace L Hyatt MSW, LCSWA  07/30/2015, 11:14 AM

## 2015-07-30 NOTE — [facility] Group Notes (Signed)
[facility] Group Notes:  (Nursing/MHT/Case Management/Adjunct)  Date:  07/30/2015  Time:  1:40 PM  Type of Therapy:  Psychoeducational Skills  Participation Level:  Active  Participation Quality:  Appropriate  Affect:  Appropriate  Cognitive:  Appropriate  Insight:  Appropriate  Engagement in Group:  Engaged  Modes of Intervention:  Discussion, Education and Support  Summary of Progress/Problems:  Darrow Bussing 07/30/2015, 1:40 PM
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
## 2016-03-27 ENCOUNTER — Emergency Department
Admission: EM | Admit: 2016-03-27 | Discharge: 2016-03-29 | Disposition: A | Discharge status: Home / Self Care | Payer: Self-pay | Attending: Emergency Medicine | Admitting: Emergency Medicine

## 2016-03-27 DIAGNOSIS — Z7984 Long term (current) use of oral hypoglycemic drugs: Secondary | ICD-10-CM | POA: Insufficient documentation

## 2016-03-27 DIAGNOSIS — F1920 Other psychoactive substance dependence, uncomplicated: Secondary | ICD-10-CM | POA: Insufficient documentation

## 2016-03-27 DIAGNOSIS — F1290 Cannabis use, unspecified, uncomplicated: Secondary | ICD-10-CM | POA: Insufficient documentation

## 2016-03-27 DIAGNOSIS — F322 Major depressive disorder, single episode, severe without psychotic features: Secondary | ICD-10-CM | POA: Diagnosis present

## 2016-03-27 DIAGNOSIS — F329 Major depressive disorder, single episode, unspecified: Secondary | ICD-10-CM | POA: Insufficient documentation

## 2016-03-27 DIAGNOSIS — Z79899 Other long term (current) drug therapy: Secondary | ICD-10-CM | POA: Insufficient documentation

## 2016-03-27 DIAGNOSIS — F1994 Other psychoactive substance use, unspecified with psychoactive substance-induced mood disorder: Secondary | ICD-10-CM

## 2016-03-27 DIAGNOSIS — E119 Type 2 diabetes mellitus without complications: Secondary | ICD-10-CM | POA: Insufficient documentation

## 2016-03-27 DIAGNOSIS — Z87891 Personal history of nicotine dependence: Secondary | ICD-10-CM | POA: Insufficient documentation

## 2016-03-27 DIAGNOSIS — F1210 Cannabis abuse, uncomplicated: Secondary | ICD-10-CM

## 2016-03-27 DIAGNOSIS — F32A Depression, unspecified: Secondary | ICD-10-CM

## 2016-03-27 DIAGNOSIS — E039 Hypothyroidism, unspecified: Secondary | ICD-10-CM | POA: Insufficient documentation

## 2016-03-27 DIAGNOSIS — F1121 Opioid dependence, in remission: Secondary | ICD-10-CM | POA: Diagnosis present

## 2016-03-27 LAB — CBC
HEMATOCRIT: 33.6 % — AB (ref 35.0–47.0)
HEMOGLOBIN: 11 g/dL — AB (ref 12.0–16.0)
MCH: 27 pg (ref 26.0–34.0)
MCHC: 32.7 g/dL (ref 32.0–36.0)
MCV: 82.7 fL (ref 80.0–100.0)
Platelets: 280 10*3/uL (ref 150–440)
RBC: 4.06 MIL/uL (ref 3.80–5.20)
RDW: 14.5 % (ref 11.5–14.5)
WBC: 6.5 10*3/uL (ref 3.6–11.0)

## 2016-03-27 LAB — URINE DRUG SCREEN, QUALITATIVE (ARMC ONLY)
AMPHETAMINES, UR SCREEN: NOT DETECTED
Barbiturates, Ur Screen: NOT DETECTED
Benzodiazepine, Ur Scrn: POSITIVE — AB
COCAINE METABOLITE, UR ~~LOC~~: POSITIVE — AB
Cannabinoid 50 Ng, Ur ~~LOC~~: POSITIVE — AB
MDMA (Ecstasy)Ur Screen: NOT DETECTED
METHADONE SCREEN, URINE: NOT DETECTED
OPIATE, UR SCREEN: POSITIVE — AB
Phencyclidine (PCP) Ur S: NOT DETECTED
Tricyclic, Ur Screen: NOT DETECTED

## 2016-03-27 LAB — COMPREHENSIVE METABOLIC PANEL
ALBUMIN: 4.3 g/dL (ref 3.5–5.0)
ALK PHOS: 43 U/L (ref 38–126)
ALT: 64 U/L — ABNORMAL HIGH (ref 14–54)
ANION GAP: 11 (ref 5–15)
AST: 62 U/L — AB (ref 15–41)
BUN: 10 mg/dL (ref 6–20)
CALCIUM: 9.7 mg/dL (ref 8.9–10.3)
CO2: 24 mmol/L (ref 22–32)
Chloride: 102 mmol/L (ref 101–111)
Creatinine, Ser: 0.69 mg/dL (ref 0.44–1.00)
GFR calc Af Amer: >60 mL/min (ref >60)
GFR calc non Af Amer: >60 mL/min (ref >60)
GLUCOSE: 180 mg/dL — AB (ref 65–99)
POTASSIUM: 3.4 mmol/L — AB (ref 3.5–5.1)
SODIUM: 137 mmol/L (ref 135–145)
Total Bilirubin: 1.2 mg/dL (ref 0.3–1.2)
Total Protein: 9.2 g/dL — ABNORMAL HIGH (ref 6.5–8.1)

## 2016-03-27 LAB — ETHANOL: Alcohol, Ethyl (B): <5 mg/dL (ref <5)

## 2016-03-27 LAB — PREGNANCY, URINE: Preg Test, Ur: NEGATIVE

## 2016-03-27 LAB — ACETAMINOPHEN LEVEL

## 2016-03-27 LAB — SALICYLATE LEVEL: Salicylate Lvl: <4 mg/dL (ref 2.8–30.0)

## 2016-03-27 MED ORDER — LORAZEPAM 2 MG/ML IJ SOLN
INTRAMUSCULAR | Status: AC
  Administered 2016-03-27: 2 mg via INTRAVENOUS
  Filled 2016-03-27: qty 1

## 2016-03-27 MED ORDER — HALOPERIDOL LACTATE 5 MG/ML IJ SOLN
5.0000 mg | Freq: Once | INTRAMUSCULAR | Status: AC
  Administered 2016-03-27: 5 mg via INTRAMUSCULAR

## 2016-03-27 MED ORDER — LORAZEPAM 2 MG/ML IJ SOLN
2.0000 mg | Freq: Once | INTRAMUSCULAR | Status: AC
  Administered 2016-03-27: 2 mg via INTRAVENOUS

## 2016-03-27 MED ORDER — HALOPERIDOL 5 MG PO TABS: 5.0000 mg | ORAL_TABLET | Freq: Once | ORAL | Status: DC

## 2016-03-27 MED ORDER — LORAZEPAM 2 MG PO TABS: 2.0000 mg | ORAL_TABLET | Freq: Once | ORAL | Status: DC

## 2016-03-27 MED ORDER — HALOPERIDOL LACTATE 5 MG/ML IJ SOLN
INTRAMUSCULAR | Status: AC
  Administered 2016-03-27: 5 mg via INTRAMUSCULAR
  Filled 2016-03-27: qty 1

## 2016-03-27 NOTE — ED Notes (Signed)

## 2016-03-27 NOTE — ED Provider Notes (Signed)
-----------------------------------------   11:11 PM on 03/27/2016 -----------------------------------------  Patient was agitated and aggressive and was given medication to calm her down. Patient doing better at this time.  Stephanie PlantJames A Jadah Bobak, MD 03/27/16 [phone]

## 2016-03-27 NOTE — ED Notes (Signed)
Report given to SOC 

## 2016-03-27 NOTE — ED Notes (Signed)
Patient assigned to appropriate care area. Patient oriented to unit/care area: Informed that, for their safety, care areas are designed for safety and monitored by security cameras at all times; and visiting hours explained to patient. Patient verbalizes understanding, and verbal contract for safety obtained.  Pt would not acknowledge RN's presence in her room. Rn did not obtain any information. No distress noted. Maintained on 15 minute checks and observation by security camera for safety.
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
## 2016-03-27 NOTE — ED Notes (Signed)
Pt to be moved to [facility]  Report given to Ruthie RN  Pt to transfer at this time

## 2016-03-27 NOTE — ED Notes (Signed)
Patient asleep in room. No noted distress or abnormal behavior. Will continue 15 minute checks and observation by security cameras for safety. 

## 2016-03-27 NOTE — ED Notes (Signed)
Pt observed lying in bed  - NAD observed  She has no verbalized needs or concerns at this time
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
## 2016-03-27 NOTE — ED Notes (Signed)
Pt changed into scrubs and belongings secured.. Pt has shirt, shorts, flip flops and rings.
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
## 2016-03-27 NOTE — ED Notes (Addendum)
[facility] Patient upset because she is not being discharged. Patient begain throwing food and bed material in room. Patient went into bathroom and threw trash bag and paper towels. SOC made aware and recommended 2mg  Ativan, 5 mg Haldol IM. ED doctor made aware of behaviors and recommendations of SOC. Verbal order of IM Ativan 2mg  and IM haldol 5 mg. Security was called to assist because patient was refusing medications. Patient was placed of stomach and medications administered IM in left Ventrogluteal. Patient is currently laying in bed resting watching TV. Vitals stable.

## 2016-03-28 DIAGNOSIS — F3481 Disruptive mood dysregulation disorder: Secondary | ICD-10-CM | POA: Insufficient documentation

## 2016-03-28 MED ORDER — QUETIAPINE FUMARATE 25 MG PO TABS
25.0000 mg | ORAL_TABLET | Freq: Three times a day (TID) | ORAL | Status: DC
  Administered 2016-03-28 – 2016-03-29 (×2): 25 mg via ORAL
  Filled 2016-03-28 (×2): qty 1

## 2016-03-28 MED ORDER — GLIPIZIDE ER 10 MG PO TB24
10.0000 mg | ORAL_TABLET | Freq: Every day | ORAL | Status: DC
  Administered 2016-03-29: 10 mg via ORAL
  Filled 2016-03-28 (×3): qty 1

## 2016-03-28 MED ORDER — CHLORDIAZEPOXIDE HCL 10 MG PO CAPS
10.0000 mg | ORAL_CAPSULE | Freq: Three times a day (TID) | ORAL | Status: DC
  Administered 2016-03-28 – 2016-03-29 (×2): 10 mg via ORAL
  Filled 2016-03-28 (×2): qty 1

## 2016-03-28 MED ORDER — LEVOTHYROXINE SODIUM 25 MCG PO TABS
25.0000 ug | ORAL_TABLET | Freq: Every day | ORAL | Status: DC
  Administered 2016-03-29: 25 ug via ORAL
  Filled 2016-03-28: qty 1

## 2016-03-28 NOTE — ED Notes (Signed)
Patient asleep in room. No noted distress or abnormal behavior. Will continue 15 minute checks and observation by security cameras for safety. 

## 2016-03-28 NOTE — ED Provider Notes (Signed)
-----------------------------------------   6:41 AM on 03/28/2016 -----------------------------------------   Blood pressure 110/71, pulse 97, temperature 97.6 F (36.4 C), temperature source Oral, resp. rate 18, height 5\' 5"  (1.651 m), last menstrual period 03/27/2016, SpO2 100 %.  The patient had no acute events since last update.  Calm and cooperative at this time.  Disposition is pending per Psychiatry/Behavioral Medicine team recommendations.     Irean HongJade J Sung, MD 03/28/16 [phone]

## 2016-03-28 NOTE — ED Notes (Addendum)
Pt refused medications, VS and dinner/drink. Pt given phone to make a call. Pt spoke loudly at times, but did return phone and appeared calm. No concerns voiced. No distress noted. Maintained on 15 minute checks and observation by security camera for safety.

## 2016-03-28 NOTE — ED Notes (Signed)
Patient noted sleeping in room. No complaints, stable, in no acute distress. Q15 minute rounds and monitoring via Security Cameras to continue.  
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
## 2016-03-28 NOTE — ED Notes (Signed)
ED BHU PLACEMENT JUSTIFICATION Is the patient under IVC or is there intent for IVC: Yes.   Is the patient medically cleared: Yes.   Is there vacancy in the ED BHU: Yes.   Is the population mix appropriate for patient: Yes.   Is the patient awaiting placement in inpatient or outpatient setting: Yes.   Has the patient had a psychiatric consult: Yes.   Survey of unit performed for contraband, proper placement and condition of furniture, tampering with fixtures in bathroom, shower, and each patient room: Yes.  ; Findings: NA APPEARANCE/BEHAVIOR calm and cooperative NEURO ASSESSMENT Orientation: time, place and person Hallucinations: No.None noted (Hallucinations) Speech: Normal Gait: normal RESPIRATORY ASSESSMENT Normal expansion.  Clear to auscultation.  No rales, rhonchi, or wheezing. CARDIOVASCULAR ASSESSMENT regular rate and rhythm, S1, S2 normal, no murmur, click, rub or gallop GASTROINTESTINAL ASSESSMENT soft, nontender, BS WNL, no r/g EXTREMITIES normal strength, tone, and muscle mass PLAN OF CARE Provide calm/safe environment. Vital signs assessed twice daily. ED BHU Assessment once each 12-hour shift. Collaborate with intake RN daily or as condition indicates. Assure the ED provider has rounded once each shift. Provide and encourage hygiene. Provide redirection as needed. Assess for escalating behavior; address immediately and inform ED provider.  Assess family dynamic and appropriateness for visitation as needed: Yes.  ; If necessary, describe findings: NA Educate the patient/family about BHU procedures/visitation: Yes.  ; If necessary, describe findings: NA 

## 2016-03-28 NOTE — ED Notes (Signed)
Patient noted in day room. No complaints, stable, in no acute distress. Q15 minute rounds and monitoring via Security Cameras to continue. 

## 2016-03-28 NOTE — ED Notes (Signed)

## 2016-03-28 NOTE — ED Notes (Signed)
Pt laying in bed. Pt did not respond to staff when asked if she would like her lunch tray. Pt upset after learning she will be admitted for inpatient treatment.  No distress noted. Maintained on 15 minute checks and observation by security camera for safety.

## 2016-03-28 NOTE — ED Notes (Signed)
Patient noted in room. No complaints, stable, in no acute distress. Q15 minute rounds and monitoring via Security Cameras to continue.  

## 2016-03-28 NOTE — ED Notes (Signed)
Pt's husband called and pt was given the phone. After call pt came to dayroom upset and agitated. Pt spoke with security. Pt is blaming others, taking no responsibility for her actions. Pt denies having an outburst last night. Pt then returned to her room and laid down in bed. No distress noted. Maintained on 15 minute checks and observation by security camera for safety.

## 2016-03-28 NOTE — ED Notes (Signed)
Snack and beverage given. 

## 2016-03-28 NOTE — ED Notes (Signed)
Report received from Amy RN. Patient alert and oriented, warm and dry, in no acute distress. Patient denies SI, HI, AVH and pain. Patient made aware of Q15 minute rounds and security cameras for their safety. Patient instructed to come to me with needs or concerns. 

## 2016-03-28 NOTE — ED Notes (Signed)
SOC started. 

## 2016-03-29 DIAGNOSIS — F1994 Other psychoactive substance use, unspecified with psychoactive substance-induced mood disorder: Secondary | ICD-10-CM

## 2016-03-29 DIAGNOSIS — F1210 Cannabis abuse, uncomplicated: Secondary | ICD-10-CM

## 2016-03-29 NOTE — ED Notes (Signed)
Patient noted sleeping in room. No complaints, stable, in no acute distress. Q15 minute rounds and monitoring via Security Cameras to continue.  
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
## 2016-03-29 NOTE — Discharge Instructions (Signed)
Stop using drugs.   See your doctor.   Return to ER if you have depression, thoughts of harming yourself or others, overdose on drugs

## 2016-03-29 NOTE — ED Provider Notes (Signed)
  Physical Exam  BP 108/66 mmHg  Pulse 97  Temp(Src) 98.2 F (36.8 C) (Oral)  Resp 16  Ht 5\' 5"  (1.651 m)  SpO2 100%  LMP 03/27/2016  Physical Exam  ED Course  Procedures  MDM Patient seen by Dr. Gerre Pebbleslapac. IVC rescinded by him. Patient's UDS positive for multiple drugs. Likely polysubstance abuse. Vitals stable. Will dc home with outpatient resources      Richardean Canalavid H Lathyn Griggs, MD 03/29/16 1152

## 2016-03-29 NOTE — ED Notes (Signed)
Patient sitting in dayroom, laughing and talking. Patient states that she feels better and is glad to be leaving today.

## 2016-03-29 NOTE — ED Notes (Signed)
Dr. Clapacs talking with patient.  

## 2016-03-29 NOTE — ED Provider Notes (Signed)
-----------------------------------------   7:15 AM on 03/29/2016 -----------------------------------------   Blood pressure 108/66, pulse 97, temperature 98.2 F (36.8 C), temperature source Oral, resp. rate 16, height 5\' 5"  (1.651 m), last menstrual period 03/27/2016, SpO2 100 %.  The patient had no acute events since last update.  Calm and cooperative at this time.  Disposition is pending per Psychiatry/Behavioral Medicine team recommendations.     Irean HongJade J Jazmine Heckman, MD 03/29/16 [phone]
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
## 2016-03-29 NOTE — ED Notes (Signed)
Patient noted sleeping  room. No complaints, stable, in no acute distress. Q15 minute rounds and monitoring via [employer] Cam[employer] to continue. 

## 2016-03-29 NOTE — ED Notes (Signed)
Patient with discharge orders, voices understanding of discharge instructions, Patient's belongings given back to patient. Patient without evidence of distress.

## 2016-06-11 ENCOUNTER — Emergency Department
Admission: EM | Admit: 2016-06-11 | Discharge: 2016-06-11 | Disposition: A | Discharge status: Home / Self Care | Payer: Self-pay | Attending: Emergency Medicine | Admitting: Emergency Medicine

## 2016-06-11 ENCOUNTER — Emergency Department: Payer: Self-pay

## 2016-06-11 DIAGNOSIS — E039 Hypothyroidism, unspecified: Secondary | ICD-10-CM | POA: Insufficient documentation

## 2016-06-11 DIAGNOSIS — Y929 Unspecified place or not applicable: Secondary | ICD-10-CM | POA: Insufficient documentation

## 2016-06-11 DIAGNOSIS — E119 Type 2 diabetes mellitus without complications: Secondary | ICD-10-CM | POA: Insufficient documentation

## 2016-06-11 DIAGNOSIS — S0083XA Contusion of other part of head, initial encounter: Secondary | ICD-10-CM

## 2016-06-11 DIAGNOSIS — S01511A Laceration without foreign body of lip, initial encounter: Secondary | ICD-10-CM | POA: Insufficient documentation

## 2016-06-11 DIAGNOSIS — Z79899 Other long term (current) drug therapy: Secondary | ICD-10-CM | POA: Insufficient documentation

## 2016-06-11 DIAGNOSIS — Z87891 Personal history of nicotine dependence: Secondary | ICD-10-CM | POA: Insufficient documentation

## 2016-06-11 DIAGNOSIS — IMO0002 Reserved for concepts with insufficient information to code with codable children: Secondary | ICD-10-CM

## 2016-06-11 DIAGNOSIS — Y999 Unspecified external cause status: Secondary | ICD-10-CM | POA: Insufficient documentation

## 2016-06-11 DIAGNOSIS — Y939 Activity, unspecified: Secondary | ICD-10-CM | POA: Insufficient documentation

## 2016-06-11 MED ORDER — OXYCODONE-ACETAMINOPHEN 5-325 MG PO TABS: 2.0000 | ORAL_TABLET | Freq: Four times a day (QID) | ORAL | 0 refills | Status: DC | PRN

## 2016-06-11 MED ORDER — OXYCODONE-ACETAMINOPHEN 5-325 MG PO TABS
2.0000 | ORAL_TABLET | Freq: Once | ORAL | Status: AC
  Administered 2016-06-11: 2 via ORAL
  Filled 2016-06-11: qty 2
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
## 2017-08-31 ENCOUNTER — Other Ambulatory Visit: Payer: Self-pay | Admitting: Family

## 2017-09-04 ENCOUNTER — Other Ambulatory Visit: Payer: Self-pay | Admitting: Family

## 2017-09-04 DIAGNOSIS — R935 Abnormal findings on diagnostic imaging of other abdominal regions, including retroperitoneum: Secondary | ICD-10-CM

## 2019-04-07 ENCOUNTER — Emergency Department
Admission: EM | Admit: 2019-04-07 | Discharge: 2019-04-09 | Disposition: A | Discharge status: Other Acute Inpatient Hospital | Payer: Medicare Other | Attending: Emergency Medicine | Admitting: Emergency Medicine

## 2019-04-07 DIAGNOSIS — Z7984 Long term (current) use of oral hypoglycemic drugs: Secondary | ICD-10-CM | POA: Diagnosis not present

## 2019-04-07 DIAGNOSIS — R45851 Suicidal ideations: Secondary | ICD-10-CM | POA: Insufficient documentation

## 2019-04-07 DIAGNOSIS — Z79899 Other long term (current) drug therapy: Secondary | ICD-10-CM | POA: Diagnosis not present

## 2019-04-07 DIAGNOSIS — Z1159 Encounter for screening for other viral diseases: Secondary | ICD-10-CM | POA: Diagnosis not present

## 2019-04-07 DIAGNOSIS — R4689 Other symptoms and signs involving appearance and behavior: Secondary | ICD-10-CM | POA: Diagnosis present

## 2019-04-07 DIAGNOSIS — F1223 Cannabis dependence with withdrawal: Secondary | ICD-10-CM

## 2019-04-07 DIAGNOSIS — F3163 Bipolar disorder, current episode mixed, severe, without psychotic features: Secondary | ICD-10-CM | POA: Insufficient documentation

## 2019-04-07 DIAGNOSIS — Z87891 Personal history of nicotine dependence: Secondary | ICD-10-CM | POA: Insufficient documentation

## 2019-04-07 DIAGNOSIS — E039 Hypothyroidism, unspecified: Secondary | ICD-10-CM | POA: Insufficient documentation

## 2019-04-07 DIAGNOSIS — F1220 Cannabis dependence, uncomplicated: Secondary | ICD-10-CM | POA: Diagnosis present

## 2019-04-07 DIAGNOSIS — E119 Type 2 diabetes mellitus without complications: Secondary | ICD-10-CM | POA: Insufficient documentation

## 2019-04-07 DIAGNOSIS — F1293 Cannabis use, unspecified with withdrawal: Secondary | ICD-10-CM

## 2019-04-07 LAB — CBC WITH DIFFERENTIAL/PLATELET
Abs Immature Granulocytes: 0.03 10*3/uL (ref 0.00–0.07)
Basophils Absolute: 0.1 10*3/uL (ref 0.0–0.1)
Basophils Relative: 1 %
Eosinophils Absolute: 0.1 10*3/uL (ref 0.0–0.5)
Eosinophils Relative: 1 %
HCT: 32.8 % — ABNORMAL LOW (ref 36.0–46.0)
Hemoglobin: 11.4 g/dL — ABNORMAL LOW (ref 12.0–15.0)
Immature Granulocytes: 0 %
Lymphocytes Relative: 20 %
Lymphs Abs: 1.9 10*3/uL (ref 0.7–4.0)
MCH: 29.1 pg (ref 26.0–34.0)
MCHC: 34.8 g/dL (ref 30.0–36.0)
MCV: 83.7 fL (ref 80.0–100.0)
Monocytes Absolute: 0.7 10*3/uL (ref 0.1–1.0)
Monocytes Relative: 7 %
Neutro Abs: 7 10*3/uL (ref 1.7–7.7)
Neutrophils Relative %: 71 %
Platelets: 226 10*3/uL (ref 150–400)
RBC: 3.92 MIL/uL (ref 3.87–5.11)
RDW: 12.3 % (ref 11.5–15.5)
WBC: 9.7 10*3/uL (ref 4.0–10.5)
nRBC: 0 % (ref 0.0–0.2)

## 2019-04-07 LAB — COMPREHENSIVE METABOLIC PANEL
ALT: 108 U/L — ABNORMAL HIGH (ref 0–44)
AST: 121 U/L — ABNORMAL HIGH (ref 15–41)
Albumin: 4 g/dL (ref 3.5–5.0)
Alkaline Phosphatase: 49 U/L (ref 38–126)
Anion gap: 13 (ref 5–15)
BUN: 22 mg/dL — ABNORMAL HIGH (ref 6–20)
CO2: 20 mmol/L — ABNORMAL LOW (ref 22–32)
Calcium: 9.3 mg/dL (ref 8.9–10.3)
Chloride: 94 mmol/L — ABNORMAL LOW (ref 98–111)
Creatinine, Ser: 0.98 mg/dL (ref 0.44–1.00)
GFR calc Af Amer: >60 mL/min (ref >60)
GFR calc non Af Amer: >60 mL/min (ref >60)
Glucose, Bld: 552 mg/dL (ref 70–99)
Potassium: 3.5 mmol/L (ref 3.5–5.1)
Sodium: 127 mmol/L — ABNORMAL LOW (ref 135–145)
Total Bilirubin: 0.9 mg/dL (ref 0.3–1.2)
Total Protein: 8.2 g/dL — ABNORMAL HIGH (ref 6.5–8.1)

## 2019-04-07 LAB — SALICYLATE LEVEL: Salicylate Lvl: <7 mg/dL (ref 2.8–30.0)

## 2019-04-07 LAB — ETHANOL: Alcohol, Ethyl (B): <10 mg/dL (ref <10)

## 2019-04-07 LAB — ACETAMINOPHEN LEVEL: Acetaminophen (Tylenol), Serum: <10 ug/mL — ABNORMAL LOW (ref 10–30)

## 2019-04-07 MED ORDER — HALOPERIDOL LACTATE 5 MG/ML IJ SOLN
5.0000 mg | Freq: Once | INTRAMUSCULAR | Status: AC
  Administered 2019-04-07: 5 mg via INTRAMUSCULAR
  Filled 2019-04-07: qty 1

## 2019-04-07 MED ORDER — INSULIN ASPART 100 UNIT/ML ~~LOC~~ SOLN
5.0000 [IU] | Freq: Once | SUBCUTANEOUS | Status: AC
  Administered 2019-04-07: 5 [IU] via SUBCUTANEOUS
  Filled 2019-04-07: qty 1

## 2019-04-07 MED ORDER — INSULIN ASPART 100 UNIT/ML ~~LOC~~ SOLN
10.0000 [IU] | Freq: Once | SUBCUTANEOUS | Status: AC
  Administered 2019-04-07: 10 [IU] via SUBCUTANEOUS
  Filled 2019-04-07: qty 1

## 2019-04-07 MED ORDER — HALOPERIDOL LACTATE 5 MG/ML IJ SOLN
5.0000 mg | Freq: Once | INTRAMUSCULAR | Status: AC
  Administered 2019-04-07: 5 mg via INTRAMUSCULAR

## 2019-04-07 MED ORDER — LORAZEPAM 2 MG/ML IJ SOLN
2.0000 mg | Freq: Once | INTRAMUSCULAR | Status: AC
  Administered 2019-04-07: 2 mg via INTRAMUSCULAR

## 2019-04-07 NOTE — ED Notes (Signed)
Pt's belongings: - 9 silver colored rings - Black T shirt - Green underwear - Floral shorts - Black sports bra - 1 lighter - 1 pink hair tie
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
## 2019-04-07 NOTE — ED Notes (Signed)
Patient blood sugar is 558 mg/dl, notified MD. Ordered Insulin Aspart 5 units

## 2019-04-07 NOTE — ED Notes (Addendum)
Patient came into to ED via [facility]. Patient very hostile and verbally aggressive towards staff and police officers. Patient kept telling the officers she hated them, and once in her room, patient was talked to by ED Dr. Cinda Quest and informed if she cooperated she would not be given IM Ativan and Haldol. Patient said she would cooperate because she did not want any medication. Patient then went to sit on bed with handcuffs as directed. Patient was then told she would be dressed out into scrubs due to dress policy, patient then stated "I want to wear my own clothes, I am on my menstrual cycle", patient was then informed we dont have tampons but we have pads for her. Patient then said I am not giving you my clothing, Dr. Cinda Quest then informed patient if she did not dress in scrubs she would get medication, patient began yelling and screaming. Patient given Ativan and Haldol IM
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
## 2019-04-07 NOTE — ED Notes (Signed)
Gave patient turkey tray and gingerale.AS °
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
## 2019-04-08 DIAGNOSIS — E039 Hypothyroidism, unspecified: Secondary | ICD-10-CM

## 2019-04-08 DIAGNOSIS — R4689 Other symptoms and signs involving appearance and behavior: Secondary | ICD-10-CM | POA: Diagnosis not present

## 2019-04-08 DIAGNOSIS — R45851 Suicidal ideations: Secondary | ICD-10-CM | POA: Diagnosis not present

## 2019-04-08 DIAGNOSIS — F1220 Cannabis dependence, uncomplicated: Secondary | ICD-10-CM | POA: Diagnosis not present

## 2019-04-08 DIAGNOSIS — F3163 Bipolar disorder, current episode mixed, severe, without psychotic features: Secondary | ICD-10-CM | POA: Diagnosis not present

## 2019-04-08 DIAGNOSIS — F1223 Cannabis dependence with withdrawal: Secondary | ICD-10-CM | POA: Diagnosis not present

## 2019-04-08 DIAGNOSIS — F1293 Cannabis use, unspecified with withdrawal: Secondary | ICD-10-CM

## 2019-04-08 LAB — URINALYSIS, COMPLETE (UACMP) WITH MICROSCOPIC
Bacteria, UA: NONE SEEN
Bilirubin Urine: NEGATIVE
Glucose, UA: >=500 mg/dL — AB
Ketones, ur: NEGATIVE mg/dL
Leukocytes,Ua: NEGATIVE
Nitrite: NEGATIVE
Protein, ur: NEGATIVE mg/dL
Specific Gravity, Urine: 1.029 (ref 1.005–1.030)
pH: 5 (ref 5.0–8.0)

## 2019-04-08 LAB — GLUCOSE, CAPILLARY
Glucose-Capillary: 558 mg/dL (ref 70–99)
Glucose-Capillary: 512 mg/dL (ref 70–99)
Glucose-Capillary: 347 mg/dL — ABNORMAL HIGH (ref 70–99)
Glucose-Capillary: 210 mg/dL — ABNORMAL HIGH (ref 70–99)
Glucose-Capillary: 291 mg/dL — ABNORMAL HIGH (ref 70–99)
Glucose-Capillary: 280 mg/dL — ABNORMAL HIGH (ref 70–99)
Glucose-Capillary: 181 mg/dL — ABNORMAL HIGH (ref 70–99)

## 2019-04-08 LAB — URINE DRUG SCREEN, QUALITATIVE (ARMC ONLY)
Amphetamines, Ur Screen: NOT DETECTED
Barbiturates, Ur Screen: NOT DETECTED
Benzodiazepine, Ur Scrn: POSITIVE — AB
Cannabinoid 50 Ng, Ur ~~LOC~~: POSITIVE — AB
Cocaine Metabolite,Ur ~~LOC~~: NOT DETECTED
MDMA (Ecstasy)Ur Screen: NOT DETECTED
Methadone Scn, Ur: NOT DETECTED
Opiate, Ur Screen: NOT DETECTED
Phencyclidine (PCP) Ur S: NOT DETECTED
Tricyclic, Ur Screen: NOT DETECTED

## 2019-04-08 LAB — SARS CORONAVIRUS 2 BY RT PCR (HOSPITAL ORDER, PERFORMED IN ~~LOC~~ HOSPITAL LAB): SARS Coronavirus 2: NEGATIVE

## 2019-04-08 LAB — BASIC METABOLIC PANEL
Anion gap: 10 (ref 5–15)
BUN: 17 mg/dL (ref 6–20)
CO2: 21 mmol/L — ABNORMAL LOW (ref 22–32)
Calcium: 9.5 mg/dL (ref 8.9–10.3)
Chloride: 102 mmol/L (ref 98–111)
Creatinine, Ser: 0.61 mg/dL (ref 0.44–1.00)
GFR calc Af Amer: >60 mL/min (ref >60)
GFR calc non Af Amer: >60 mL/min (ref >60)
Glucose, Bld: 264 mg/dL — ABNORMAL HIGH (ref 70–99)
Potassium: 3.2 mmol/L — ABNORMAL LOW (ref 3.5–5.1)
Sodium: 133 mmol/L — ABNORMAL LOW (ref 135–145)

## 2019-04-08 LAB — POCT PREGNANCY, URINE: Preg Test, Ur: NEGATIVE

## 2019-04-08 LAB — LIPID PANEL
Cholesterol: 201 mg/dL — ABNORMAL HIGH (ref 0–200)
HDL: 23 mg/dL — ABNORMAL LOW (ref >40)
LDL Cholesterol: UNDETERMINED mg/dL (ref 0–99)
Total CHOL/HDL Ratio: 8.7 RATIO
Triglycerides: 466 mg/dL — ABNORMAL HIGH (ref <150)
VLDL: UNDETERMINED mg/dL (ref 0–40)

## 2019-04-08 LAB — TSH: TSH: 9.841 u[IU]/mL — ABNORMAL HIGH (ref 0.350–4.500)

## 2019-04-08 LAB — PREGNANCY, URINE: Preg Test, Ur: NEGATIVE

## 2019-04-08 MED ORDER — GABAPENTIN 400 MG PO CAPS
800.0000 mg | ORAL_CAPSULE | Freq: Four times a day (QID) | ORAL | Status: DC
  Administered 2019-04-09: 800 mg via ORAL
  Filled 2019-04-08 (×2): qty 2
  Filled 2019-04-08: qty 8
  Filled 2019-04-08: qty 2

## 2019-04-08 MED ORDER — DIAZEPAM 5 MG PO TABS
5.0000 mg | ORAL_TABLET | Freq: Once | ORAL | Status: AC
  Administered 2019-04-08: 5 mg via ORAL
  Filled 2019-04-08: qty 1

## 2019-04-08 MED ORDER — LORATADINE 10 MG PO TABS: 10.0000 mg | ORAL_TABLET | Freq: Every day | ORAL | Status: DC

## 2019-04-08 MED ORDER — GLIPIZIDE 10 MG PO TABS
10.0000 mg | ORAL_TABLET | Freq: Two times a day (BID) | ORAL | Status: DC
  Administered 2019-04-09: 10 mg via ORAL
  Filled 2019-04-08: qty 1

## 2019-04-08 MED ORDER — INSULIN ASPART 100 UNIT/ML ~~LOC~~ SOLN
0.0000 [IU] | Freq: Every day | SUBCUTANEOUS | Status: DC
  Administered 2019-04-09: 2 [IU] via SUBCUTANEOUS
  Filled 2019-04-08: qty 1

## 2019-04-08 MED ORDER — LEVOTHYROXINE SODIUM 88 MCG PO TABS
88.0000 ug | ORAL_TABLET | Freq: Every day | ORAL | Status: DC
  Administered 2019-04-09: 88 ug via ORAL
  Filled 2019-04-08 (×2): qty 1

## 2019-04-08 MED ORDER — VENLAFAXINE HCL ER 75 MG PO CP24: 225.0000 mg | ORAL_CAPSULE | Freq: Every day | ORAL | Status: DC

## 2019-04-08 MED ORDER — EMPAGLIFLOZIN 25 MG PO TABS: 25.0000 mg | ORAL_TABLET | Freq: Every day | ORAL | Status: DC

## 2019-04-08 MED ORDER — INSULIN ASPART 100 UNIT/ML ~~LOC~~ SOLN
0.0000 [IU] | Freq: Three times a day (TID) | SUBCUTANEOUS | Status: DC
  Administered 2019-04-08: 4 [IU] via SUBCUTANEOUS
  Administered 2019-04-08 (×2): 11 [IU] via SUBCUTANEOUS
  Filled 2019-04-08 (×3): qty 1

## 2019-04-08 MED ORDER — GABAPENTIN 400 MG PO CAPS
800.0000 mg | ORAL_CAPSULE | Freq: Once | ORAL | Status: AC
  Administered 2019-04-08: 800 mg via ORAL
  Filled 2019-04-08: qty 2

## 2019-04-08 MED ORDER — ZOLPIDEM TARTRATE 5 MG PO TABS
5.0000 mg | ORAL_TABLET | Freq: Every day | ORAL | Status: DC
  Administered 2019-04-09: 5 mg via ORAL
  Filled 2019-04-08: qty 1

## 2019-04-08 MED ORDER — PANTOPRAZOLE SODIUM 40 MG PO TBEC: 40.0000 mg | DELAYED_RELEASE_TABLET | Freq: Every day | ORAL | Status: DC

## 2019-04-08 MED ORDER — METFORMIN HCL 500 MG PO TABS
1000.0000 mg | ORAL_TABLET | Freq: Two times a day (BID) | ORAL | Status: DC
  Administered 2019-04-09: 1000 mg via ORAL
  Filled 2019-04-08: qty 2

## 2019-04-08 MED ORDER — VENLAFAXINE HCL ER 75 MG PO CP24: 150.0000 mg | ORAL_CAPSULE | Freq: Every day | ORAL | Status: DC

## 2019-04-08 MED ORDER — GABAPENTIN 300 MG PO CAPS
800.0000 mg | ORAL_CAPSULE | Freq: Three times a day (TID) | ORAL | Status: DC | PRN
  Administered 2019-04-08: 800 mg via ORAL
  Filled 2019-04-08 (×3): qty 2

## 2019-04-08 MED ORDER — DIAZEPAM 2 MG PO TABS
2.0000 mg | ORAL_TABLET | Freq: Once | ORAL | Status: AC
  Administered 2019-04-08: 2 mg via ORAL
  Filled 2019-04-08: qty 1

## 2019-04-08 MED ORDER — HYDROXYZINE HCL 25 MG PO TABS: 50.0000 mg | ORAL_TABLET | Freq: Three times a day (TID) | ORAL | Status: DC | PRN

## 2019-04-08 NOTE — ED Notes (Signed)
IVC/  PENDING  PLACEMENT 

## 2019-04-08 NOTE — ED Notes (Signed)
Moved to [facility]-7/ IVC/ SOC completed/Accepted to [facility] after 8 Am

## 2019-04-08 NOTE — ED Notes (Signed)
Report to include Situation, Background, Assessment, and Recommendations received from Wendy RN. Patient alert and oriented, warm and dry, in no acute distress. Patient denies SI, HI, AVH and pain. Patient made aware of Q15 minute rounds and Rover and Officer presence for their safety. Patient instructed to come to me with needs or concerns.  

## 2019-04-08 NOTE — ED Notes (Signed)
CBG taken, result 210.AS
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
## 2019-04-08 NOTE — ED Notes (Signed)
BEHAVIORAL HEALTH ROUNDING Patient sleeping: No. Patient alert and oriented: yes Behavior appropriate: Yes.  ; If no, describe:  Nutrition and fluids offered: yes Toileting and hygiene offered: Yes  Sitter present: q15 minute observations and security camera monitoring Law enforcement present: Yes  ODS  
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
## 2019-04-08 NOTE — BH Assessment (Signed)
Writer spoke with [place] (Rachel-[phone]) and submitted CPS report.

## 2019-04-08 NOTE — ED Notes (Signed)
Introduced myself - she denies pain  Plan of care discussed including pending transfer for inpt psychiatric hospitalization  Pt became angry and stated she refused her meds and she refuses to allow me to check her hs CBG  Continue to monitor

## 2019-04-08 NOTE — BH Assessment (Signed)
Referral information for Psychiatric Hospitalization faxed to;    [facility] ([phone]),    [facility] ([phone]--[phone])   [facility] ([phone])   [facility] ([phone]---[phone]---[phone]),   [place] ([phone], [phone], [phone] or [phone]),    [place] ([phone] or [phone])   [place] ([phone]),    [place] [facility] ([phone]),    [place] ([phone])   [place] ([phone])  

## 2019-04-08 NOTE — ED Notes (Addendum)
This RN introduced self to pt. Pt asked if this RN could check CBG and give pt her insulin/other night time meds. Pt refused and made threats to this RN if I tried. Karma Greaser, MD called in reference to issue. MD okay with refusal of meds and CBG check at this time. Will reassess in AM.

## 2019-04-08 NOTE — ED Notes (Signed)
Pt complaining of pain in her legs due to DM. EDP made aware. Medications ordered. Will give as soon as available.
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
## 2019-04-08 NOTE — ED Notes (Signed)
Patient is awake, she is asking if she is going to get to go home soon, nurse let her know as soon as she finds out something she will let her know, she is calm and cooperative.

## 2019-04-08 NOTE — ED Notes (Signed)
Patient has been accepted to [facility].  Patient assigned to [facility] Accepting physician is Dr. Jonelle Sports.  Call report to [phone].  Representative was [employer].   ER Staff is aware of it:  [facility] ER Secretary  Dr. Kerman Passey, ER MD  Stephanie Barry Patient's Nurse    Patient is to arrive after 8 a.m.

## 2019-04-08 NOTE — ED Notes (Signed)
Patient is sleeping, respirations even and unlabored, will continue to monitor.

## 2019-04-08 NOTE — ED Notes (Signed)
Report given to SOC MD.  

## 2019-04-08 NOTE — Progress Notes (Signed)
Inpatient Diabetes Program Recommendations  AACE/ADA: New Consensus Statement on Inpatient Glycemic Control   Target Ranges:  Prepandial:   less than 140 mg/dL      Peak postprandial:   less than 180 mg/dL (1-2 hours)      Critically ill patients:  140 - 180 mg/dL   Results for NILAM, QUAKENBUSH (MRN [id number]) as of 04/08/2019 09:15  Ref. Range 04/07/2019 19:45 04/07/2019 21:43 04/07/2019 23:01 04/08/2019 02:46 04/08/2019 07:54  Glucose-Capillary Latest Ref Range: 70 - 99 mg/dL 558 (HH) 512 (HH) 347 (H) 210 (H) 291 (H)   Review of Glycemic Control  Diabetes history: DM2 Outpatient Diabetes medications: Glipizide 10 mg BID, Metformin 1000 mg BID, Jardiance 25 mg daily Current orders for Inpatient glycemic control: Novolog 0-20 units TID with meals, Novolog 0-5 units QHS  Inpatient Diabetes Program Recommendations:   Insulin - Basal: Please consider ordering Lantus 13 units Q24H starting now (based on 89.8 kg x 0.15 units).  Insulin - Meal Coverage: Please consider ordering Novolog 3 units TID with meals for meal coverage if patient eats at least 50% of meals.  Thanks, Barnie Alderman, RN, MSN, CDE Diabetes Coordinator Inpatient Diabetes Program [phone] (Team Pager from 8am to 5pm)

## 2019-04-08 NOTE — ED Notes (Signed)
Pt speaking  on phone with her mother. Pt has lost her voice, but attempting to yell. Maintained on 15 minute checks.

## 2019-04-09 DIAGNOSIS — F3163 Bipolar disorder, current episode mixed, severe, without psychotic features: Secondary | ICD-10-CM | POA: Diagnosis not present

## 2019-04-09 LAB — GLUCOSE, CAPILLARY
Glucose-Capillary: 237 mg/dL — ABNORMAL HIGH (ref 70–99)
Glucose-Capillary: 254 mg/dL — ABNORMAL HIGH (ref 70–99)

## 2019-04-09 LAB — HEMOGLOBIN A1C
Hgb A1c MFr Bld: 11.8 % — ABNORMAL HIGH (ref 4.8–5.6)
Mean Plasma Glucose: 291.96 mg/dL

## 2019-04-09 LAB — LDL CHOLESTEROL, DIRECT: Direct LDL: 115.3 mg/dL — ABNORMAL HIGH (ref 0–99)

## 2019-04-09 NOTE — ED Notes (Signed)
EMTALA reviewed. 

## 2019-04-09 NOTE — ED Notes (Signed)
Pt CBG 254. Ignatius Specking, RN made aware.

## 2019-04-09 NOTE — ED Provider Notes (Signed)
-----------------------------------------   7:16 AM on 04/09/2019 -----------------------------------------   Blood pressure (!) 130/91, pulse (!) 106, temperature 97.9 F (36.6 C), temperature source Oral, resp. rate 16, SpO2 100 %.  The patient is calm and cooperative at this time.  There have been no acute events since the last update.  Awaiting disposition plan from Behavioral Medicine team.   Arta Silence, MD 04/09/19 [phone]

## 2019-04-09 NOTE — ED Notes (Signed)
IVC, ACCEPTED TO [facility]

## 2019-04-09 NOTE — ED Notes (Signed)
Pt talked with her mother Shellee Milo (or Maya Arcand her brother)to pick up her debit/EBT card to provide food for her children who are with her mother, card placed in envelope and pin# and left at the front desk for pick up.

## 2019-08-08 ENCOUNTER — Other Ambulatory Visit: Payer: Self-pay | Admitting: Registered Nurse

## 2019-08-08 DIAGNOSIS — R1901 Right upper quadrant abdominal swelling, mass and lump: Secondary | ICD-10-CM

## 2019-08-15 ENCOUNTER — Ambulatory Visit: Admission: RE | Admit: 2019-08-15 | Payer: Medicare Other | Source: Ambulatory Visit

## 2019-08-20 ENCOUNTER — Other Ambulatory Visit: Payer: Self-pay

## 2019-08-20 ENCOUNTER — Ambulatory Visit
Admission: RE | Admit: 2019-08-20 | Discharge: 2019-08-20 | Disposition: A | Discharge status: Home / Self Care | Payer: Medicare Other | Source: Ambulatory Visit | Attending: Registered Nurse | Admitting: Registered Nurse

## 2019-08-20 DIAGNOSIS — R1901 Right upper quadrant abdominal swelling, mass and lump: Secondary | ICD-10-CM | POA: Insufficient documentation

## 2020-05-13 ENCOUNTER — Other Ambulatory Visit: Payer: Self-pay | Admitting: Family Medicine

## 2020-05-13 DIAGNOSIS — E041 Nontoxic single thyroid nodule: Secondary | ICD-10-CM

## 2020-05-22 ENCOUNTER — Other Ambulatory Visit: Payer: Self-pay | Admitting: Registered Nurse

## 2020-05-22 DIAGNOSIS — N921 Excessive and frequent menstruation with irregular cycle: Secondary | ICD-10-CM

## 2020-05-27 ENCOUNTER — Ambulatory Visit: Payer: Medicare Other

## 2020-06-08 ENCOUNTER — Ambulatory Visit: Payer: Medicare Other | Attending: Registered Nurse

## 2020-06-13 ENCOUNTER — Encounter: Payer: Self-pay | Admitting: Emergency Medicine

## 2020-06-13 ENCOUNTER — Observation Stay
Admission: EM | Admit: 2020-06-13 | Discharge: 2020-06-14 | Disposition: A | Discharge status: Home / Self Care | Payer: Medicare Other | Attending: Emergency Medicine | Admitting: Emergency Medicine

## 2020-06-13 ENCOUNTER — Emergency Department: Payer: Medicare Other

## 2020-06-13 ENCOUNTER — Other Ambulatory Visit: Payer: Self-pay

## 2020-06-13 DIAGNOSIS — G9341 Metabolic encephalopathy: Secondary | ICD-10-CM | POA: Diagnosis not present

## 2020-06-13 DIAGNOSIS — Z7984 Long term (current) use of oral hypoglycemic drugs: Secondary | ICD-10-CM | POA: Insufficient documentation

## 2020-06-13 DIAGNOSIS — R4182 Altered mental status, unspecified: Secondary | ICD-10-CM | POA: Insufficient documentation

## 2020-06-13 DIAGNOSIS — Z79899 Other long term (current) drug therapy: Secondary | ICD-10-CM | POA: Diagnosis not present

## 2020-06-13 DIAGNOSIS — I10 Essential (primary) hypertension: Secondary | ICD-10-CM | POA: Insufficient documentation

## 2020-06-13 DIAGNOSIS — Z20822 Contact with and (suspected) exposure to covid-19: Secondary | ICD-10-CM | POA: Insufficient documentation

## 2020-06-13 DIAGNOSIS — E119 Type 2 diabetes mellitus without complications: Secondary | ICD-10-CM | POA: Diagnosis not present

## 2020-06-13 DIAGNOSIS — N390 Urinary tract infection, site not specified: Secondary | ICD-10-CM | POA: Insufficient documentation

## 2020-06-13 DIAGNOSIS — Z87891 Personal history of nicotine dependence: Secondary | ICD-10-CM | POA: Insufficient documentation

## 2020-06-13 DIAGNOSIS — F1910 Other psychoactive substance abuse, uncomplicated: Secondary | ICD-10-CM | POA: Insufficient documentation

## 2020-06-13 DIAGNOSIS — T450X1A Poisoning by antiallergic and antiemetic drugs, accidental (unintentional), initial encounter: Principal | ICD-10-CM | POA: Insufficient documentation

## 2020-06-13 DIAGNOSIS — F1210 Cannabis abuse, uncomplicated: Secondary | ICD-10-CM

## 2020-06-13 DIAGNOSIS — T50901A Poisoning by unspecified drugs, medicaments and biological substances, accidental (unintentional), initial encounter: Secondary | ICD-10-CM | POA: Diagnosis present

## 2020-06-13 DIAGNOSIS — F329 Major depressive disorder, single episode, unspecified: Secondary | ICD-10-CM | POA: Diagnosis present

## 2020-06-13 DIAGNOSIS — T50904A Poisoning by unspecified drugs, medicaments and biological substances, undetermined, initial encounter: Secondary | ICD-10-CM

## 2020-06-13 DIAGNOSIS — E039 Hypothyroidism, unspecified: Secondary | ICD-10-CM | POA: Insufficient documentation

## 2020-06-13 DIAGNOSIS — F32A Depression, unspecified: Secondary | ICD-10-CM | POA: Diagnosis present

## 2020-06-13 DIAGNOSIS — F1410 Cocaine abuse, uncomplicated: Secondary | ICD-10-CM | POA: Diagnosis not present

## 2020-06-13 LAB — COMPREHENSIVE METABOLIC PANEL
ALT: 13 U/L (ref 0–44)
AST: 24 U/L (ref 15–41)
Albumin: 5 g/dL (ref 3.5–5.0)
Alkaline Phosphatase: 46 U/L (ref 38–126)
Anion gap: 11 (ref 5–15)
BUN: 18 mg/dL (ref 6–20)
CO2: 24 mmol/L (ref 22–32)
Calcium: 9.6 mg/dL (ref 8.9–10.3)
Chloride: 100 mmol/L (ref 98–111)
Creatinine, Ser: 0.93 mg/dL (ref 0.44–1.00)
GFR calc Af Amer: >60 mL/min (ref >60)
GFR calc non Af Amer: >60 mL/min (ref >60)
Glucose, Bld: 98 mg/dL (ref 70–99)
Potassium: 3.9 mmol/L (ref 3.5–5.1)
Sodium: 135 mmol/L (ref 135–145)
Total Bilirubin: 0.5 mg/dL (ref 0.3–1.2)
Total Protein: 8.8 g/dL — ABNORMAL HIGH (ref 6.5–8.1)

## 2020-06-13 LAB — CBC WITH DIFFERENTIAL/PLATELET
Abs Immature Granulocytes: 0.07 10*3/uL (ref 0.00–0.07)
Basophils Absolute: 0 10*3/uL (ref 0.0–0.1)
Basophils Relative: 0 %
Eosinophils Absolute: 0.2 10*3/uL (ref 0.0–0.5)
Eosinophils Relative: 1 %
HCT: 37.1 % (ref 36.0–46.0)
Hemoglobin: 12.4 g/dL (ref 12.0–15.0)
Immature Granulocytes: 1 %
Lymphocytes Relative: 23 %
Lymphs Abs: 2.7 10*3/uL (ref 0.7–4.0)
MCH: 29.6 pg (ref 26.0–34.0)
MCHC: 33.4 g/dL (ref 30.0–36.0)
MCV: 88.5 fL (ref 80.0–100.0)
Monocytes Absolute: 0.9 10*3/uL (ref 0.1–1.0)
Monocytes Relative: 8 %
Neutro Abs: 7.9 10*3/uL — ABNORMAL HIGH (ref 1.7–7.7)
Neutrophils Relative %: 67 %
Platelets: 243 10*3/uL (ref 150–400)
RBC: 4.19 MIL/uL (ref 3.87–5.11)
RDW: 13.6 % (ref 11.5–15.5)
WBC: 11.8 10*3/uL — ABNORMAL HIGH (ref 4.0–10.5)
nRBC: 0 % (ref 0.0–0.2)

## 2020-06-13 LAB — URINE DRUG SCREEN, QUALITATIVE (ARMC ONLY)
Amphetamines, Ur Screen: NOT DETECTED
Barbiturates, Ur Screen: NOT DETECTED
Benzodiazepine, Ur Scrn: NOT DETECTED
Cannabinoid 50 Ng, Ur ~~LOC~~: POSITIVE — AB
Cocaine Metabolite,Ur ~~LOC~~: POSITIVE — AB
MDMA (Ecstasy)Ur Screen: NOT DETECTED
Methadone Scn, Ur: NOT DETECTED
Opiate, Ur Screen: NOT DETECTED
Phencyclidine (PCP) Ur S: NOT DETECTED
Tricyclic, Ur Screen: POSITIVE — AB

## 2020-06-13 LAB — GLUCOSE, CAPILLARY
Glucose-Capillary: 55 mg/dL — ABNORMAL LOW (ref 70–99)
Glucose-Capillary: 71 mg/dL (ref 70–99)
Glucose-Capillary: 65 mg/dL — ABNORMAL LOW (ref 70–99)
Glucose-Capillary: 90 mg/dL (ref 70–99)
Glucose-Capillary: 107 mg/dL — ABNORMAL HIGH (ref 70–99)
Glucose-Capillary: 52 mg/dL — ABNORMAL LOW (ref 70–99)
Glucose-Capillary: 63 mg/dL — ABNORMAL LOW (ref 70–99)
Glucose-Capillary: 170 mg/dL — ABNORMAL HIGH (ref 70–99)

## 2020-06-13 LAB — URINALYSIS, COMPLETE (UACMP) WITH MICROSCOPIC
Glucose, UA: >=500 mg/dL — AB
Hgb urine dipstick: NEGATIVE
Ketones, ur: NEGATIVE mg/dL
Leukocytes,Ua: NEGATIVE
Nitrite: POSITIVE — AB
Protein, ur: 30 mg/dL — AB
Specific Gravity, Urine: 1.022 (ref 1.005–1.030)
pH: 5 (ref 5.0–8.0)

## 2020-06-13 LAB — ACETAMINOPHEN LEVEL: Acetaminophen (Tylenol), Serum: <10 ug/mL — ABNORMAL LOW (ref 10–30)

## 2020-06-13 LAB — PREGNANCY, URINE: Preg Test, Ur: NEGATIVE

## 2020-06-13 LAB — TROPONIN I (HIGH SENSITIVITY)
Troponin I (High Sensitivity): <2 ng/L (ref <18)
Troponin I (High Sensitivity): <2 ng/L (ref <18)

## 2020-06-13 LAB — SALICYLATE LEVEL: Salicylate Lvl: <7 mg/dL — ABNORMAL LOW (ref 7.0–30.0)

## 2020-06-13 LAB — SARS CORONAVIRUS 2 BY RT PCR (HOSPITAL ORDER, PERFORMED IN ~~LOC~~ HOSPITAL LAB): SARS Coronavirus 2: NEGATIVE

## 2020-06-13 LAB — ETHANOL: Alcohol, Ethyl (B): <10 mg/dL (ref <10)

## 2020-06-13 MED ORDER — LORAZEPAM 2 MG/ML IJ SOLN
2.0000 mg | Freq: Once | INTRAMUSCULAR | Status: AC
  Administered 2020-06-13: 2 mg via INTRAVENOUS

## 2020-06-13 MED ORDER — HALOPERIDOL LACTATE 5 MG/ML IJ SOLN
5.0000 mg | Freq: Once | INTRAMUSCULAR | Status: AC
  Administered 2020-06-13: 5 mg via INTRAVENOUS
  Filled 2020-06-13: qty 1

## 2020-06-13 MED ORDER — SODIUM CHLORIDE 0.9 % IV SOLN: INTRAVENOUS | Status: DC

## 2020-06-13 MED ORDER — LORAZEPAM 2 MG/ML IJ SOLN: 1.0000 mg | INTRAMUSCULAR | Status: DC | PRN

## 2020-06-13 MED ORDER — SODIUM CHLORIDE 0.9 % IV SOLN
1.0000 g | Freq: Once | INTRAVENOUS | Status: AC
  Administered 2020-06-13: 1 g via INTRAVENOUS
  Filled 2020-06-13: qty 10

## 2020-06-13 MED ORDER — ACETAMINOPHEN 325 MG PO TABS: 650.0000 mg | ORAL_TABLET | Freq: Four times a day (QID) | ORAL | Status: DC | PRN

## 2020-06-13 MED ORDER — PANTOPRAZOLE SODIUM 40 MG PO TBEC
40.0000 mg | DELAYED_RELEASE_TABLET | Freq: Every day | ORAL | Status: DC
  Administered 2020-06-14: 40 mg via ORAL
  Filled 2020-06-13: qty 1

## 2020-06-13 MED ORDER — DROPERIDOL 2.5 MG/ML IJ SOLN
2.5000 mg | Freq: Once | INTRAMUSCULAR | Status: AC
  Administered 2020-06-13: 2.5 mg via INTRAVENOUS

## 2020-06-13 MED ORDER — LISINOPRIL 2.5 MG PO TABS
2.5000 mg | ORAL_TABLET | Freq: Every day | ORAL | Status: DC
  Administered 2020-06-14: 2.5 mg via ORAL
  Filled 2020-06-13 (×2): qty 1

## 2020-06-13 MED ORDER — ENOXAPARIN SODIUM 40 MG/0.4ML ~~LOC~~ SOLN: 40.0000 mg | SUBCUTANEOUS | Status: DC

## 2020-06-13 MED ORDER — LEVOTHYROXINE SODIUM 100 MCG PO TABS
100.0000 ug | ORAL_TABLET | Freq: Every day | ORAL | Status: DC
  Administered 2020-06-14: 100 ug via ORAL
  Filled 2020-06-13: qty 1

## 2020-06-13 MED ORDER — INSULIN ASPART 100 UNIT/ML ~~LOC~~ SOLN: 0.0000 [IU] | Freq: Every day | SUBCUTANEOUS | Status: DC

## 2020-06-13 MED ORDER — LORAZEPAM 2 MG/ML IJ SOLN
2.0000 mg | Freq: Once | INTRAMUSCULAR | Status: AC
  Administered 2020-06-13: 2 mg via INTRAVENOUS
  Filled 2020-06-13: qty 1

## 2020-06-13 MED ORDER — SODIUM CHLORIDE 0.9 % IV SOLN
1.0000 g | INTRAVENOUS | Status: DC
  Administered 2020-06-14: 1 g via INTRAVENOUS
  Filled 2020-06-13: qty 1

## 2020-06-13 MED ORDER — MIDAZOLAM HCL 2 MG/2ML IJ SOLN
2.0000 mg | Freq: Once | INTRAMUSCULAR | Status: AC
  Administered 2020-06-13: 2 mg via INTRAVENOUS
  Filled 2020-06-13: qty 2

## 2020-06-13 MED ORDER — INSULIN ASPART 100 UNIT/ML ~~LOC~~ SOLN
0.0000 [IU] | Freq: Three times a day (TID) | SUBCUTANEOUS | Status: DC
  Administered 2020-06-14: 1 [IU] via SUBCUTANEOUS
  Filled 2020-06-13: qty 1

## 2020-06-13 MED ORDER — SODIUM CHLORIDE 0.9 % IV BOLUS
1000.0000 mL | Freq: Once | INTRAVENOUS | Status: AC
  Administered 2020-06-13: 1000 mL via INTRAVENOUS

## 2020-06-13 MED ORDER — ONDANSETRON HCL 4 MG/2ML IJ SOLN: 4.0000 mg | Freq: Three times a day (TID) | INTRAMUSCULAR | Status: DC | PRN

## 2020-06-13 NOTE — ED Notes (Signed)
Lab at bedside

## 2020-06-13 NOTE — H&P (Signed)
History and Physical    Stephanie Barry WIO:[id number] DOB: [birth year] DOA: 06/13/2020  Referring MD/NP/PA:   PCP: [facility]   Patient coming from:  The patient is coming from home.  At baseline, pt is independent for most of ADL.        Chief Complaint: overdose and AMS  HPI: Stephanie Barry is a 39 y.o. female with medical history significant of hypertension, diabetes mellitus, GERD, hypothyroidism, depression, HCV, cannabis abuse, suicidal ideation, opiate use disorder, PTSD, who presents with overdose and altered mental status.  Per her mother (I called her mother by phone), patient overdosed unknown amount of Benadryl.  She also possibly took some melatonin, Suboxone and Neurontin.  Patient was initially altered, being aggressive with hallucination.  She also had possible jerking movement per report.  Her mental status has gradually improved.  When I saw patient in the ED, she is mildly drowsy, but oriented x3.  Patient answered all questions appropriately.  Patient denies chest pain, cough, shortness breath.  Mother states that patient had diarrhea, but the patient denies any diarrhea, nausea, vomiting or abdominal pain.  Patient states that she has urinary urgency and burning sensation, no dysuria.  No hematuria.  Denies suicidal or homicidal ideations.  She moves all extremities normally.  No facial droop or slurred speech.   ED Course: pt was found to have urinalysis (hazy appearance, negative leukocyte, positive nitrite, WBC 0-5), UDS positive for tricyclic, cannabis and cocaine), troponin <2, pending pregnancy test, alcohol level less than 10, negative Covid PCR, electrolytes renal function okay, temperature normal, blood pressure 173/115, tachycardia, tachypnea, oxygen saturation 100% on room air.  Chest x-ray has no infiltration.  CT head is negative for acute intracranial abnormalities.  Patient is placed on MedSurg bed for observation. Pt is IVC'ed  Review  of Systems:   General: no fevers, chills, no body weight gain, has fatigue HEENT: no blurry vision, hearing changes or sore throat Respiratory: no dyspnea, coughing, wheezing CV: no chest pain, no palpitations GI: no nausea, vomiting, abdominal pain, diarrhea, constipation GU: no dysuria, has burning on urination and urinary urgency, no hematuria  Ext: no leg edema Neuro: no unilateral weakness, numbness, or tingling, no vision change or hearing loss. Has AMS Skin: no rash, no skin tear. MSK: No muscle spasm, no deformity, no limitation of range of movement in spin Heme: No easy bruising.  Travel history: No recent long distant travel.  Allergy: No Known Allergies  Past Medical History:  Diagnosis Date  . Diabetes mellitus without complication (HCC)   . Hepatitis C   . Thyroid disease     Past Surgical History:  Procedure Laterality Date  . TUBAL LIGATION      Social History:  reports that she has quit smoking. She has never used smokeless tobacco. She reports current drug use. Drugs: Marijuana and Cocaine. She reports that she does not drink alcohol.  Family History:  Family History  Problem Relation Age of Onset  . Diabetes Mellitus II Mother   . Diabetes Mellitus II Father      Prior to Admission medications   Medication Sig Start Date End Date Taking? Authorizing Provider  cetirizine (ZYRTEC) 10 MG tablet Take 10 mg by mouth daily. 02/26/19   [provider]  EUTHYROX 88 MCG tablet Take 88 mcg by mouth daily before breakfast. 04/04/19   [provider]  gabapentin (NEURONTIN) 800 MG tablet Take 800 mg by mouth 4 (four) times daily. 04/05/19  [provider]  glipiZIDE (GLUCOTROL) 10 MG tablet Take 10 mg by mouth 2 (two) times daily. 02/26/19   [provider]  hydrOXYzine (ATARAX/VISTARIL) 50 MG tablet Take 50 mg by mouth 3 (three) times daily as needed for anxiety or itching. 02/26/19   [provider]  JARDIANCE 25 MG TABS  tablet Take 25 mg by mouth daily. 02/12/19   [provider]  metFORMIN (GLUCOPHAGE) 1000 MG tablet Take 1,000 mg by mouth 2 (two) times daily. 02/27/19   [provider]  omeprazole (PRILOSEC) 20 MG capsule Take 20 mg by mouth daily. 02/27/19   [provider]  venlafaxine XR (EFFEXOR-XR) 75 MG 24 hr capsule Take 225 mg by mouth daily. 02/26/19   [provider]  zolpidem (AMBIEN) 5 MG tablet Take 5 mg by mouth at bedtime. 04/05/19   [provider]    Physical Exam: Vitals:   06/13/20 1112 06/13/20 1200 06/13/20 1300 06/13/20 1330  BP: 110/82 120/62 117/90 117/89  Pulse: 93 91 93 90  Resp: 15 14 16 18   Temp:      TempSrc:      SpO2: 100% 100% 100% 100%   General: Not in acute distress HEENT:       Eyes: PERRL, EOMI, no scleral icterus.       ENT: No discharge from the ears and nose, no pharynx injection, no tonsillar enlargement.        Neck: No JVD, no bruit, no mass felt. Heme: No neck lymph node enlargement. Cardiac: S1/S2, RRR, No murmurs, No gallops or rubs. Respiratory: No rales, wheezing, rhonchi or rubs. GI: Soft, nondistended, nontender, no rebound pain, no organomegaly, BS present. GU: No hematuria Ext: No pitting leg edema bilaterally. 2+DP/PT pulse bilaterally. Musculoskeletal: No joint deformities, No joint redness or warmth, no limitation of ROM in spin. Skin: No rashes.  Neuro: drowsy, but is oriented X3, following command, answers all questions appropriately.  Cranial nerves II-XII grossly intact, moves all extremities normally. Psych: Patient is not psychotic, no suicidal or hemocidal ideation.  Labs on Admission: I have personally reviewed following labs and imaging studies  CBC: Recent Labs  Lab 06/13/20 0357  WBC 11.8*  NEUTROABS 7.9*  HGB 12.4  HCT 37.1  MCV 88.5  PLT 243   Basic Metabolic Panel: Recent Labs  Lab 06/13/20 0357  NA 135  K 3.9  CL 100  CO2 24  GLUCOSE 98  BUN 18  CREATININE 0.93   CALCIUM 9.6   GFR: CrCl cannot be calculated (Unknown ideal weight.). Liver Function Tests: Recent Labs  Lab 06/13/20 0357  AST 24  ALT 13  ALKPHOS 46  BILITOT 0.5  PROT 8.8*  ALBUMIN 5.0   No results for input(s): LIPASE, AMYLASE in the last 168 hours. No results for input(s): AMMONIA in the last 168 hours. Coagulation Profile: No results for input(s): INR, PROTIME in the last 168 hours. Cardiac Enzymes: No results for input(s): CKTOTAL, CKMB, CKMBINDEX, TROPONINI in the last 168 hours. BNP (last 3 results) No results for input(s): PROBNP in the last 8760 hours. HbA1C: No results for input(s): HGBA1C in the last 72 hours. CBG: Recent Labs  Lab 06/13/20 1300 06/13/20 1333  GLUCAP 55* 71   Lipid Profile: No results for input(s): CHOL, HDL, LDLCALC, TRIG, CHOLHDL, LDLDIRECT in the last 72 hours. Thyroid Function Tests: No results for input(s): TSH, T4TOTAL, FREET4, T3FREE, THYROIDAB in the last 72 hours. Anemia Panel: No results for input(s): VITAMINB12, FOLATE, FERRITIN, TIBC, IRON,  RETICCTPCT in the last 72 hours. Urine analysis:    Component Value Date/Time   COLORURINE YELLOW (A) 06/13/2020 0357   APPEARANCEUR HAZY (A) 06/13/2020 0357   LABSPEC 1.022 06/13/2020 0357   PHURINE 5.0 06/13/2020 0357   GLUCOSEU >=500 (A) 06/13/2020 0357   HGBUR NEGATIVE 06/13/2020 0357   BILIRUBINUR SMALL (A) 06/13/2020 0357   KETONESUR NEGATIVE 06/13/2020 0357   PROTEINUR 30 (A) 06/13/2020 0357   NITRITE POSITIVE (A) 06/13/2020 0357   LEUKOCYTESUR NEGATIVE 06/13/2020 0357   Sepsis Labs: @LABRCNTIP (procalcitonin:4,lacticidven:4) ) Recent Results (from the past 240 hour(s))  SARS Coronavirus 2 by RT PCR (hospital order, performed in [facility] hospital lab) Nasopharyngeal Nasopharyngeal Swab     Status: None   Collection Time: 06/13/20  3:57 AM   Specimen: Nasopharyngeal Swab  Result Value Ref Range Status   SARS Coronavirus 2 NEGATIVE NEGATIVE Final    Comment:  (NOTE) SARS-CoV-2 target nucleic acids are NOT DETECTED.  The SARS-CoV-2 RNA is generally detectable in upper and lower respiratory specimens during the acute phase of infection. The lowest concentration of SARS-CoV-2 viral copies this assay can detect is 250 copies / mL. A negative result does not preclude SARS-CoV-2 infection and should not be used as the sole basis for treatment or other patient management decisions.  A negative result may occur with improper specimen collection / handling, submission of specimen other than nasopharyngeal swab, presence of viral mutation(s) within the areas targeted by this assay, and inadequate number of viral copies (<250 copies / mL). A negative result must be combined with clinical observations, patient history, and epidemiological information.  Fact Sheet for Patients:   06/15/20  Fact Sheet for Healthcare Providers: BoilerBrush.com.cy  This test is not yet approved or  cleared by the https://pope.com/ FDA and has been authorized for detection and/or diagnosis of SARS-CoV-2 by FDA under an Emergency Use Authorization (EUA).  This EUA will remain in effect (meaning this test can be used) for the duration of the COVID-19 declaration under Section 564(b)(1) of the Act, 21 U.S.C. section 360bbb-3(b)(1), unless the authorization is terminated or revoked sooner.  Performed at [facility], [address] [place], [place]      Radiological Exams on Admission: CT Head Wo Contrast  Result Date: 06/13/2020 CLINICAL DATA:  Altered mental status EXAM: CT HEAD WITHOUT CONTRAST TECHNIQUE: Contiguous axial images were obtained from the base of the skull through the vertex without intravenous contrast. COMPARISON:  06/11/2016 FINDINGS: Brain: No evidence of acute infarction, hemorrhage, hydrocephalus, extra-axial collection or mass lesion/mass effect. Vascular: No hyperdense vessel  or unexpected calcification. Skull: Normal. Negative for fracture or focal lesion. Sinuses/Orbits: No acute finding. Other: None. IMPRESSION: No acute intracranial abnormality noted. No significant change from the prior exam. Electronically Signed   By: 08/11/2016 M.D.   On: 06/13/2020 07:09   DG Chest Port 1 View  Result Date: 06/13/2020 CLINICAL DATA:  Initial evaluation for acute overdose. EXAM: PORTABLE CHEST 1 VIEW COMPARISON:  None available. FINDINGS: Cardiac and mediastinal silhouettes are within normal limits. Lungs are hypoinflated. Secondary mild bronchovascular crowding. No focal infiltrates or findings to suggest aspiration. No edema or effusion. No pneumothorax. No acute osseous finding. IMPRESSION: Shallow lung inflation with secondary mild bronchovascular crowding. No other active cardiopulmonary disease. Electronically Signed   By: 06/15/2020 M.D.   On: 06/13/2020 04:50     EKG: Independently reviewed.  Sinus rhythm, QTC 408, tachycardia, low voltage, nonspecific to change  Assessment/Plan Principal Problem:  Overdose Active Problems:   Hypothyroidism   Polysubstance abuse (HCC)   Diabetes mellitus without complication (HCC)   UTI (urinary tract infection)   Acute metabolic encephalopathy   HTN (hypertension)   Depression   Overdose: Patient overdosed unknown amount of Benadryl.  She also possibly took some melatonin, Suboxone and Neurontin.  Alcohol level less than 10.  Pending Tylenol level per poison control recommendations.  Her mental status has improved.  Currently patient is oriented x3. Initial EKG showed QTC 408. -Placed on MedSurg bed for observation -Follow-up atenolol -IV fluid: 1 L normal saline followed by 500 cc/h -We will repeat EKG to monitor QTC level -Hold Suboxone, Zyrtec, Neurontin, Remeron, Effexor -Dr. Smith Robert of psychiatry is consulted  Hypothyroidism -Synthroid  Polysubstance abuse (HCC): UDS is positive for tricyclic, cannabis,  cocaine -Did counseling about importance of quitting substance use  Diabetes mellitus without complication (HCC): A1c 11.8 recently, poorly controlled.  Patient is taking Metformin, Victoza, Jardiance and glipizide at home -Sliding scale insulin  UTI (urinary tract infection): -Rocephin -Follow-up blood culture urine culture  Acute metabolic encephalopathy: Likely due to overdose and substance abuse.  CT head negative.  Mental status improved already.  Patient is currently oriented x3 -Frequent neuro check  HTN: -lisinoprol  GERD: -protonix  Depression: -Hold Remeron and Effexor     DVT ppx:  SQ Lovenox Code Status: Full code Family Communication:  Yes, patient's mother by phone Disposition Plan:  Anticipate discharge back to previous environment Consults called: Dr. Smith Robert of psychiatry Admission status: Med-surg bed for obs  Status is: Observation  The patient remains OBS appropriate and will d/c before 2 midnights.  Dispo: The patient is from: Home              Anticipated d/c is to: Home              Anticipated d/c date is: 1 day              Patient currently is not medically stable to d/c.          Date of Service 06/13/2020    Lorretta Harp [facility]   If 7PM-7AM, please contact night-coverage [email or web address] 06/13/2020, 1:57 PM

## 2020-06-13 NOTE — ED Notes (Signed)
Called receiving floor again. Floor told this RN that [facility] told floor to hold pt in ER until sitter could be available. ER charge RN informed.

## 2020-06-13 NOTE — ED Notes (Signed)
Poison control outdated.

## 2020-06-13 NOTE — ED Triage Notes (Signed)
Pt arrived via EMS from home where family reported pt took unknown amount of Benadryl. Pt last known at baseline at approx 0200. Pt is lethargic, involuntary jerking, not alert to person, place, situation or time. Pt not answering questions. Family in route to ED per EMS. EMS reports hx/o PTSD, substance abuse. Pt actively hallucinating in room.

## 2020-06-13 NOTE — ED Notes (Signed)
Pt awake and with urge to urinate. Pt ambulated to toilet in rm with assistance from this tech and Kate,RN. Pt back to bed, placed on cardiac monitor.

## 2020-06-13 NOTE — Consult Note (Signed)
[facility] Face-to[facility]Face [facility] Consult   Reason for Consult:   Depression adjustment post OD  Referring Physician:  ED MD  Patient Identification: Stephanie Barry MRN:  [id number] Principal Diagnosis: Overdose Diagnosis:  Principal Problem:   Overdose Active Problems:   Hypothyroidism   Polysubstance abuse (HCC)   Diabetes mellitus without complication (HCC)   UTI (urinary tract infection)   Acute metabolic encephalopathy  Major depression moderate Generalized anxiety  Adjustment disorder mixed  Mainly past opiate dependence on suboxone   Total Time spent with patient: 30 40  Subjective:   Stephanie Barry is a 39 y.o. female patient admitted with  Issues of adjustment and depression post benadryl overdose   HPI:  Patient on IVC after taking several benadryl tabs and brought to ER.  She says she was just trying to sleep and not commit suicide.  She is awake now and wants to go home.   She has ongoing major depression without regular treatment along with anxiety and adjustment issues in dealing with four kids.   Has been tired, overweight and not getting much exercise or rest   Attempting to determine safety --via collateral with messages left to family today     Past Psychiatric History:  None recently   Rehab and detox at least six years ago she says   Risk to Self:  mild to mod Risk to Others:  none  Prior Inpatient Therapy:  none  Prior Outpatient Therapy:  none now   Past Medical History:  Past Medical History:  Diagnosis Date  . Diabetes mellitus without complication (HCC)   . Hepatitis C   . Thyroid disease     Past Surgical History:  Procedure Laterality Date  . TUBAL LIGATION     Family History:  Family History  Problem Relation Age of Onset  . Diabetes Mellitus II Mother   . Diabetes Mellitus II Father    Family Psychiatric  History:  Social History:  Social History   Substance and Sexual Activity  Alcohol Use No     Social History   Substance and  Sexual Activity  Drug Use Yes  . Types: Marijuana, Cocaine    Social History   Socioeconomic History  . Marital status: Single    Spouse name: Not on file  . Number of children: Not on file  . Years of education: Not on file  . Highest education level: Not on file  Occupational History  . Not on file  Tobacco Use  . Smoking status: Former Games developermoker  . Smokeless tobacco: Never Used  Substance and Sexual Activity  . Alcohol use: No  . Drug use: Yes    Types: Marijuana, Cocaine  . Sexual activity: Not on file  Other Topics Concern  . Not on file  Social History Narrative  . Not on file   Social Determinants of Health   Financial Resource Strain:   . Difficulty of Paying Living Expenses:   Food Insecurity:   . Worried About Programme researcher, broadcasting/film/videounning Out of Food in the Last Year:   . Baristaan Out of Food in the Last Year:   Transportation Needs:   . Freight forwarderLack of Transportation (Medical):   Marland Kitchen. Lack of Transportation (Non-Medical):   Physical Activity:   . Days of Exercise per Week:   . Minutes of Exercise per Session:   Stress:   . Feeling of Stress :   Social Connections:   . Frequency of Communication with Friends and Family:   . Frequency of Social Gatherings  with Friends and Family:   . Attends Religious Services:   . Active Member of Clubs or Organizations:   . Attends Banker Meetings:   Marland Kitchen Marital Status:    Additional Social History:    Allergies:  No Known Allergies  Labs:  Results for orders placed or performed during the hospital encounter of 06/13/20 (from the past 48 hour(s))  CBC with Differential     Status: Abnormal   Collection Time: 06/13/20  3:57 AM  Result Value Ref Range   WBC 11.8 (H) 4.0 - 10.5 K/uL   RBC 4.19 3.87 - 5.11 MIL/uL   Hemoglobin 12.4 12.0 - 15.0 g/dL   HCT 16.1 36 - 46 %   MCV 88.5 80.0 - 100.0 fL   MCH 29.6 26.0 - 34.0 pg   MCHC 33.4 30.0 - 36.0 g/dL   RDW 09.6 04.5 - 40.9 %   Platelets 243 150 - 400 K/uL   nRBC 0.0 0.0 - 0.2 %    Neutrophils Relative % 67 %   Neutro Abs 7.9 (H) 1.7 - 7.7 K/uL   Lymphocytes Relative 23 %   Lymphs Abs 2.7 0.7 - 4.0 K/uL   Monocytes Relative 8 %   Monocytes Absolute 0.9 0 - 1 K/uL   Eosinophils Relative 1 %   Eosinophils Absolute 0.2 0 - 0 K/uL   Basophils Relative 0 %   Basophils Absolute 0.0 0 - 0 K/uL   Immature Granulocytes 1 %   Abs Immature Granulocytes 0.07 0.00 - 0.07 K/uL    Comment: Performed at [facility], [address] [place], [place] [address]  Comprehensive metabolic panel     Status: Abnormal   Collection Time: 06/13/20  3:57 AM  Result Value Ref Range   Sodium 135 135 - 145 mmol/L   Potassium 3.9 3.5 - 5.1 mmol/L   Chloride 100 98 - 111 mmol/L   CO2 24 22 - 32 mmol/L   Glucose, Bld 98 70 - 99 mg/dL    Comment: Glucose reference range applies only to samples taken after fasting for at least 8 hours.   BUN 18 6 - 20 mg/dL   Creatinine, Ser 4.78 0.44 - 1.00 mg/dL   Calcium 9.6 8.9 - 29.5 mg/dL   Total Protein 8.8 (H) 6.5 - 8.1 g/dL   Albumin 5.0 3.5 - 5.0 g/dL   AST 24 15 - 41 U/L   ALT 13 0 - 44 U/L   Alkaline Phosphatase 46 38 - 126 U/L   Total Bilirubin 0.5 0.3 - 1.2 mg/dL   GFR calc non Af Amer >60 >60 mL/min   GFR calc Af Amer >60 >60 mL/min   Anion gap 11 5 - 15    Comment: Performed at [facility], [address], [place], [place] [address]  Ethanol     Status: None   Collection Time: 06/13/20  3:57 AM  Result Value Ref Range   Alcohol, Ethyl (B) <10 <10 mg/dL    Comment: (NOTE) Lowest detectable limit for serum alcohol is 10 mg/dL.  For medical purposes only. Performed at [facility], [address] [place], [place] [address]   Troponin I (High Sensitivity)     Status: None   Collection Time: 06/13/20  3:57 AM  Result Value Ref Range   Troponin I (High Sensitivity) <2 <18 ng/L    Comment: (NOTE) Elevated high sensitivity troponin I (hsTnI) values and significant  changes across serial measurements may  suggest ACS  but many other  chronic and acute conditions are known to elevate hsTnI results.  Refer to the "Links" section for chest pain algorithms and additional  guidance. Performed at [facility], [address] [place], [place] [address]   Urinalysis, Complete w Microscopic Nasopharyngeal Swab     Status: Abnormal   Collection Time: 06/13/20  3:57 AM  Result Value Ref Range   Color, Urine YELLOW (A) YELLOW   APPearance HAZY (A) CLEAR   Specific Gravity, Urine 1.022 1.005 - 1.030   pH 5.0 5.0 - 8.0   Glucose, UA >=500 (A) NEGATIVE mg/dL   Hgb urine dipstick NEGATIVE NEGATIVE   Bilirubin Urine SMALL (A) NEGATIVE   Ketones, ur NEGATIVE NEGATIVE mg/dL   Protein, ur 30 (A) NEGATIVE mg/dL   Nitrite POSITIVE (A) NEGATIVE   Leukocytes,Ua NEGATIVE NEGATIVE   WBC, UA 0-5 0 - 5 WBC/hpf   Bacteria, UA RARE (A) NONE SEEN   Squamous Epithelial / LPF 0-5 0 - 5   Hyaline Casts, UA PRESENT     Comment: Performed at [facility] [address] [place], [place] [address]  Urine Drug Screen, Qualitative     Status: Abnormal   Collection Time: 06/13/20  3:57 AM  Result Value Ref Range   Tricyclic, Ur Screen POSITIVE (A) NONE DETECTED   Amphetamines, Ur Screen NONE DETECTED NONE DETECTED   MDMA (Ecstasy)Ur Screen NONE DETECTED NONE DETECTED   Cocaine Metabolite,Ur [place] POSITIVE (A) NONE DETECTED   Opiate, Ur Screen NONE DETECTED NONE DETECTED   Phencyclidine (PCP) Ur S NONE DETECTED NONE DETECTED   Cannabinoid 50 Ng, Ur [place] POSITIVE (A) NONE DETECTED   Barbiturates, Ur Screen NONE DETECTED NONE DETECTED   Benzodiazepine, Ur Scrn NONE DETECTED NONE DETECTED   Methadone Scn, Ur NONE DETECTED NONE DETECTED    Comment: (NOTE) Tricyclics + metabolites, urine    Cutoff 1000 ng/mL Amphetamines + metabolites, urine  Cutoff 1000 ng/mL MDMA (Ecstasy), urine              Cutoff 500 ng/mL Cocaine Metabolite, urine          Cutoff 300 ng/mL Opiate + metabolites, urine        Cutoff  300 ng/mL Phencyclidine (PCP), urine         Cutoff 25 ng/mL Cannabinoid, urine                 Cutoff 50 ng/mL Barbiturates + metabolites, urine  Cutoff 200 ng/mL Benzodiazepine, urine              Cutoff 200 ng/mL Methadone, urine                   Cutoff 300 ng/mL  The urine drug screen provides only a preliminary, unconfirmed analytical test result and should not be used for non-medical purposes. Clinical consideration and professional judgment should be applied to any positive drug screen result due to possible interfering substances. A more specific alternate chemical method must be used in order to obtain a confirmed analytical result. Gas chromatography / mass spectrometry (GC/MS) is the preferred confirm atory method. Performed at [facility], [address] [place], [place] [address]   SARS Coronavirus 2 by RT PCR (hospital order, performed in [facility] hospital lab) Nasopharyngeal Nasopharyngeal Swab     Status: None   Collection Time: 06/13/20  3:57 AM   Specimen: Nasopharyngeal Swab  Result Value Ref Range   SARS Coronavirus 2 NEGATIVE NEGATIVE  Comment: (NOTE) SARS-CoV-2 target nucleic acids are NOT DETECTED.  The SARS-CoV-2 RNA is generally detectable in upper and lower respiratory specimens during the acute phase of infection. The lowest concentration of SARS-CoV-2 viral copies this assay can detect is 250 copies / mL. A negative result does not preclude SARS-CoV-2 infection and should not be used as the sole basis for treatment or other patient management decisions.  A negative result may occur with improper specimen collection / handling, submission of specimen other than nasopharyngeal swab, presence of viral mutation(s) within the areas targeted by this assay, and inadequate number of viral copies (<250 copies / mL). A negative result must be combined with clinical observations, patient history, and epidemiological information.  Fact Sheet for  Patients:   BoilerBrush.com.cy  Fact Sheet for Healthcare Providers: https://pope.com/  This test is not yet approved or  cleared by the [place] FDA and has been authorized for detection and/or diagnosis of SARS-CoV-2 by FDA under an Emergency Use Authorization (EUA).  This EUA will remain in effect (meaning this test can be used) for the duration of the COVID-19 declaration under Section 564(b)(1) of the Act, 21 U.S.C. section 360bbb-3(b)(1), unless the authorization is terminated or revoked sooner.  Performed at [facility], [address] [place], [place] [address]   Troponin I (High Sensitivity)     Status: None   Collection Time: 06/13/20  8:39 AM  Result Value Ref Range   Troponin I (High Sensitivity) <2 <18 ng/L    Comment: (NOTE) Elevated high sensitivity troponin I (hsTnI) values and significant  changes across serial measurements may suggest ACS but many other  chronic and acute conditions are known to elevate hsTnI results.  Refer to the "Links" section for chest pain algorithms and additional  guidance. Performed at [facility], [address], [place], [place] [address]     Current Facility-Administered Medications  Medication Dose Route Frequency Provider Last Rate Last Admin  . 0.9 %  sodium chloride infusion   Intravenous Continuous Lorretta Harp, MD 100 mL/hr at 06/13/20 1112 New Bag at 06/13/20 1112  . acetaminophen (TYLENOL) tablet 650 mg  650 mg Oral Q6H PRN Lorretta Harp, MD      . Melene Muller ON 06/14/2020] cefTRIAXone (ROCEPHIN) 1 g in sodium chloride 0.9 % 100 mL IVPB  1 g Intravenous Q24H Lorretta Harp, MD      . insulin aspart (novoLOG) injection 0-5 Units  0-5 Units Subcutaneous QHS Lorretta Harp, MD      . insulin aspart (novoLOG) injection 0-9 Units  0-9 Units Subcutaneous TID WC Lorretta Harp, MD      . LORazepam (ATIVAN) injection 1 mg  1 mg Intravenous Q2H PRN Lorretta Harp, MD      .  ondansetron [facility]) injection 4 mg  4 mg Intravenous Q8H PRN Lorretta Harp, MD       Current Outpatient Medications  Medication Sig Dispense Refill  . BACTRIM DS 800-160 MG tablet Take 1 tablet by mouth 2 (two) times daily.    . Buprenorphine HCl-Naloxone HCl 8-2 MG FILM Place under the tongue 3 (three) times daily.    . cetirizine (ZYRTEC) 10 MG tablet Take 10 mg by mouth daily.    Marland Kitchen gabapentin (NEURONTIN) 800 MG tablet Take 800 mg by mouth 4 (four) times daily.    Marland Kitchen JARDIANCE 25 MG TABS tablet Take 25 mg by mouth daily.    Marland Kitchen levothyroxine (SYNTHROID) 100 MCG tablet Take 100 mcg by mouth daily.    Marland Kitchen  lisinopril (ZESTRIL) 2.5 MG tablet Take 2.5 mg by mouth daily.    . metFORMIN (GLUCOPHAGE) 1000 MG tablet Take 1,000 mg by mouth 2 (two) times daily.    . mirtazapine (REMERON SOL-TAB) 15 MG disintegrating tablet Take 15 mg by mouth at bedtime.    . mirtazapine (REMERON SOL-TAB) 30 MG disintegrating tablet Take 30 mg by mouth at bedtime.    . naloxone (NARCAN) 0.4 MG/ML injection Inject into the muscle.    Marland Kitchen omeprazole (PRILOSEC) 20 MG capsule Take 20 mg by mouth daily.    Marland Kitchen venlafaxine XR (EFFEXOR-XR) 75 MG 24 hr capsule Take 225 mg by mouth daily.    Marland Kitchen VICTOZA 18 MG/3ML SOPN Inject 1.2 mg into the skin daily.      Musculoskeletal: Strength & Muscle Tone: normal  Gait & Station: normal  Patient leans: normal   Psychiatric Specialty Exam: Physical Exam  Review of Systems  Blood pressure 120/62, pulse 91, temperature 98.5 F (36.9 C), temperature source Rectal, resp. rate 14, SpO2 100 %.There is no height or weight on file to calculate BMI.  Mental status  Overweight strange and odd Mood depressed, anxious affect constricted Oriented to person place date and time Appearance tired  Rapport okay eye contact okay Judgement insight reliability fair Consciousness not clouded or fluctuant Concentration and attention okay Memory immediate with errors Recent and remote okay No movements  shakes tremors SI and HI she denies Abstraction fair Speech normal rate tone volume  Thought process and content no frank psychosis or mania   Fund of knowledge intelligence fair     Sleep up and down Cognition slower Recall slower ADL's okay Assets supportive family  Language okay Leans --n/a  Handedness not known                                                          Treatment Plan Summary:  Waiting for for further collateral and possible overnight observation ---depending on her issues and safety and outpatient plan    no other new medications yet    Disposition:   Pending collateral for possible discharge vs. Inpatient stay    Roselind Messier, MD 06/13/2020 12:50 PM

## 2020-06-13 NOTE — ED Notes (Addendum)
Psych MD and TTS at bedside.   Pt told this RN she only took "2 or 3 benadryls for my allergies." denies taking anything else. States all she takes at home is "my insulin and my makeup bag." asked pt if she was trying to hurt herself and she denies SI. Stating she wants to go home, informed she is IVC and being admitted to hospital.

## 2020-06-13 NOTE — ED Provider Notes (Signed)
[facility] Emergency Department Provider Note   ____________________________________________   First MD Initiated Contact with Patient 06/13/20 [phone]     (approximate)  I have reviewed the triage vital signs and the nursing notes.   HISTORY  Chief Complaint Benadryl overdose  Level V caveat: Limited by agitation and altered mentation   HPI Stephanie Barry is a 39 y.o. female brought to the ED via EMS from home with a chief complaint of Benadryl overdose.  Family called EMS for patient agitated, hallucinating.  Unknown time nor quantity of ingestion.  Patient does have a history of depression with prior hospitalization for intentional overdose.  Rest of history is limited secondary to patient's agitation.      Past Medical History:  Diagnosis Date  . Diabetes mellitus without complication (HCC)   . Hepatitis C   . Thyroid disease     Patient Active Problem List   Diagnosis Date Noted  . Suicidal ideation 04/08/2019  . Aggression 04/08/2019  . Cannabis withdrawal (HCC) 04/08/2019  . Cannabis abuse 03/29/2016  . Substance induced mood disorder (HCC) 03/29/2016  . Disruptive mood dysregulation disorder (HCC)   . Hypothyroidism 07/28/2015  . Cannabis use disorder, moderate, dependence (HCC) 07/28/2015  . Diabetes (HCC) 07/28/2015  . Hepatitis C 07/28/2015  . Major depressive disorder, single episode, severe without psychotic features (HCC) 07/28/2015  . Opioid use disorder, severe, in early remission (HCC) 07/28/2015    History reviewed. No pertinent surgical history.  Prior to Admission medications   Medication Sig Start Date End Date Taking? Authorizing Provider  cetirizine (ZYRTEC) 10 MG tablet Take 10 mg by mouth daily. 02/26/19   [provider]  EUTHYROX 88 MCG tablet Take 88 mcg by mouth daily before breakfast. 04/04/19   [provider]  gabapentin (NEURONTIN) 800 MG tablet Take 800 mg by mouth 4 (four) times daily. 04/05/19    [provider]  glipiZIDE (GLUCOTROL) 10 MG tablet Take 10 mg by mouth 2 (two) times daily. 02/26/19   [provider]  hydrOXYzine (ATARAX/VISTARIL) 50 MG tablet Take 50 mg by mouth 3 (three) times daily as needed for anxiety or itching. 02/26/19   [provider]  JARDIANCE 25 MG TABS tablet Take 25 mg by mouth daily. 02/12/19   [provider]  metFORMIN (GLUCOPHAGE) 1000 MG tablet Take 1,000 mg by mouth 2 (two) times daily. 02/27/19   [provider]  omeprazole (PRILOSEC) 20 MG capsule Take 20 mg by mouth daily. 02/27/19   [provider]  venlafaxine XR (EFFEXOR-XR) 75 MG 24 hr capsule Take 225 mg by mouth daily. 02/26/19   [provider]  zolpidem (AMBIEN) 5 MG tablet Take 5 mg by mouth at bedtime. 04/05/19   [provider]    Allergies Patient has no known allergies.  History reviewed. No pertinent family history.  Social History Social History   Tobacco Use  . Smoking status: Former Games developer  . Smokeless tobacco: Never Used  Substance Use Topics  . Alcohol use: No  . Drug use: Yes    Types: Marijuana    Review of Systems  Constitutional: No fever/chills Eyes: No visual changes. ENT: No sore throat. Cardiovascular: Denies chest pain. Respiratory: Denies shortness of breath. Gastrointestinal: No abdominal pain.  No nausea, no vomiting.  No diarrhea.  No constipation. Genitourinary: Negative for dysuria. Musculoskeletal: Negative for back pain. Skin: Negative for rash. Neurological: Negative for headaches, focal weakness or numbness. Psychiatric:  Positive for overdose and agitation.  ____________________________________________   PHYSICAL EXAM:  VITAL SIGNS: ED Triage Vitals  Enc Vitals Group     BP      Pulse      Resp      Temp      Temp src      SpO2      Weight      Height      Head Circumference      Peak Flow      Pain Score      Pain Loc      Pain Edu?      Excl. in GC?      Constitutional: Alert.  Disheveled appearing and in moderate acute distress. Eyes: Conjunctivae are normal. PERRL. EOMI. Head: Atraumatic. Nose: No congestion/rhinnorhea. Mouth/Throat: Mucous membranes are mildly dry.   Neck: No stridor.  No cervical spine tenderness to palpation.  No step-offs deformities noted. Cardiovascular: Tachycardic rate, regular rhythm. Grossly normal heart sounds.  Good peripheral circulation. Respiratory: Normal respiratory effort.  No retractions. Lungs CTAB. Gastrointestinal: Soft and nontender. No distention. No abdominal bruits. No CVA tenderness. Musculoskeletal: No lower extremity tenderness nor edema.  No joint effusions. Neurologic: Agitated. No gross focal neurologic deficits are appreciated.  Wildly flailing all limbs.   Skin:  Skin is warm, dry and intact. No rash noted. Psychiatric: Mood and affect are agitated, uncooperative. Speech and behavior are agitated.  Appears to be hallucinating.  ____________________________________________   LABS (all labs ordered are listed, but only abnormal results are displayed)  Labs Reviewed  CBC WITH DIFFERENTIAL/PLATELET - Abnormal; Notable for the following components:      Result Value   WBC 11.8 (*)    Neutro Abs 7.9 (*)    All other components within normal limits  COMPREHENSIVE METABOLIC PANEL - Abnormal; Notable for the following components:   Total Protein 8.8 (*)    All other components within normal limits  URINALYSIS, COMPLETE (UACMP) WITH MICROSCOPIC - Abnormal; Notable for the following components:   Color, Urine YELLOW (*)    APPearance HAZY (*)    Glucose, UA >=500 (*)    Bilirubin Urine SMALL (*)    Protein, ur 30 (*)    Nitrite POSITIVE (*)    Bacteria, UA RARE (*)    All other components within normal limits  URINE DRUG SCREEN, QUALITATIVE ([facility] ONLY) - Abnormal; Notable for the following components:   Tricyclic, Ur Screen POSITIVE (*)    Cocaine Metabolite,Ur [place] POSITIVE (*)     Cannabinoid 50 Ng, Ur [place] POSITIVE (*)    All other components within normal limits  SARS CORONAVIRUS 2 BY RT PCR (HOSPITAL ORDER, PERFORMED IN [facility])  URINE CULTURE  ETHANOL  POC URINE PREG, ED  TROPONIN I (HIGH SENSITIVITY)  TROPONIN I (HIGH SENSITIVITY)   ____________________________________________  EKG  ED ECG REPORT I, Sheikh Leverich J, the attending physician, personally viewed and interpreted this ECG.   Date: 06/13/2020  EKG Time: 0355  Rate: 102  Rhythm: sinus tachycardia  Axis: Normal  Intervals:QTC 408  ST&T Change: Nonspecific  ____________________________________________  RADIOLOGY  ED MD interpretation: No acute cardiopulmonary process; CT head pending  Official radiology report(s): DG Chest Port 1 View  Result Date: 06/13/2020 CLINICAL DATA:  Initial evaluation for acute overdose. EXAM: PORTABLE CHEST 1 VIEW COMPARISON:  None available. FINDINGS: Cardiac and mediastinal silhouettes are within normal limits. Lungs are hypoinflated. Secondary mild bronchovascular crowding. No focal infiltrates or findings to suggest aspiration. No edema or effusion. No  pneumothorax. No acute osseous finding. IMPRESSION: Shallow lung inflation with secondary mild bronchovascular crowding. No other active cardiopulmonary disease. Electronically Signed   By: Rise Mu M.D.   On: 06/13/2020 04:50    ____________________________________________   PROCEDURES  Procedure(s) performed (including Critical Care):  .1-3 Lead EKG Interpretation Performed by: Irean Hong, MD Authorized by: Irean Hong, MD     Interpretation: normal     ECG rate:  99   ECG rate assessment: normal     Rhythm: sinus rhythm     Ectopy: none     Conduction: normal   Comments:     Patient placed on cardiac monitor to evaluate for arrhythmias    CRITICAL CARE Performed by: Irean Hong   Total critical care time: 45 minutes  Critical care time was exclusive of  separately billable procedures and treating other patients.  Critical care was necessary to treat or prevent imminent or life-threatening deterioration.  Critical care was time spent personally by me on the following activities: development of treatment plan with patient and/or surrogate as well as nursing, discussions with consultants, evaluation of patient's response to treatment, examination of patient, obtaining history from patient or surrogate, ordering and performing treatments and interventions, ordering and review of laboratory studies, ordering and review of radiographic studies, pulse oximetry and re-evaluation of patient's condition.  ____________________________________________   INITIAL IMPRESSION / ASSESSMENT AND PLAN / ED COURSE  As part of my medical decision making, I reviewed the following data within the electronic MEDICAL RECORD NUMBER Nursing notes reviewed and incorporated, Labs reviewed, EKG interpreted, Old chart reviewed, Radiograph reviewed, Discussed with admitting physician and Notes from prior ED visits     Rashiya Lofland was evaluated in Emergency Department on 06/13/2020 for the symptoms described in the history of present illness. She was evaluated in the context of the global COVID-19 pandemic, which necessitated consideration that the patient might be at risk for infection with the SARS-CoV-2 virus that causes COVID-19. Institutional protocols and algorithms that pertain to the evaluation of patients at risk for COVID-19 are in a state of rapid change based on information released by regulatory bodies including the CDC and federal and state organizations. These policies and algorithms were followed during the patient's care in the ED.    39 year old female brought for Benadryl overdose. Differential diagnosis includes, but is not limited to, alcohol, illicit or prescription medications, or other toxic ingestion; intracranial pathology such as stroke or intracerebral  hemorrhage; fever or infectious causes including sepsis; hypoxemia and/or hypercarbia; uremia; trauma; endocrine related disorders such as diabetes, hypoglycemia, and thyroid-related diseases; hypertensive encephalopathy; etc.  We will obtain toxicological work-up, CT head.  Given patient's prior history of intentional overdose, will place patient under IVC.   Clinical Course as of Jun 13 654  Sat Jun 13, 2020  0408 Patient agitated, trying to pull off monitor electrodes, trying to get out of bed.  Requiring IV calming agents.   [JS]  0519 Family at bedside.  States around 3 AM they thought patient was having a seizure.  Reports patient has polysubstance abuse.  Patient is requiring additional IV calming agent in order to obtain head CT.   [JS]  [phone] Patient finally calm after an additional 2 mg IV Versed.  She is currently in CT scan.  Care is transferred to Dr. Darnelle Catalan at change of shift.  If CT head is unremarkable, anticipate admission for Benadryl overdose.   [JS]    Clinical Course User Index [JS]  Irean Hong, MD     ____________________________________________   FINAL CLINICAL IMPRESSION(S) / ED DIAGNOSES  Final diagnoses:  Cocaine abuse (HCC)  Marijuana abuse  Drug overdose, undetermined intent, initial encounter  Urinary tract infection without hematuria, site unspecified     ED Discharge Orders    None       Note:  This document was prepared using Dragon voice recognition software and may include unintentional dictation errors.   Irean Hong, MD 06/13/20 [phone]

## 2020-06-13 NOTE — ED Notes (Signed)
Pt thrashing in bed, pulling at lines/cords. Pt moved back up into bed. Safety sitter at bedside.

## 2020-06-13 NOTE — Progress Notes (Signed)
Rechecked CBG 170 an hour later. Pt given graham crackers & peanut butter.

## 2020-06-13 NOTE — ED Notes (Signed)
Pt continuing to get out of bed, thrashing around in bed, pulling at lines/cords. Safety sitter at bedside.

## 2020-06-13 NOTE — ED Notes (Signed)
Pt removed purewick. A new purewick was placed. Yellow socks placed on pt as well as a warm blanket. Mom and this tech at pt side.

## 2020-06-13 NOTE — ED Notes (Signed)
CBG 71. Pt A & O times 4. Talking with husband on phone.

## 2020-06-13 NOTE — ED Notes (Signed)
Attempted to call receiving floor. Was told there is not a sitter available and that floor was contacting AC. Awaiting return call from floor.

## 2020-06-13 NOTE — Progress Notes (Signed)
Hypoglycemic Event  CBG: 63  Treatment: 8 oz juice/soda  Symptoms: None  Follow-up CBG: Time:2126 CBG Result:107  Possible Reasons for Event: Inadequate meal intake  Comments/MD notified: Notified NP. Orders to recheck in one hour, and give pt a source of protein. Will continue to monitor.    Elenor Quinones

## 2020-06-13 NOTE — ED Notes (Signed)
Attempted to obtain blood cultures at this time. Unsuccessful. Called lab to have phlebotomist sent to ER.

## 2020-06-13 NOTE — ED Notes (Signed)
Pt provided [place] sandwich tray at this time.

## 2020-06-13 NOTE — ED Notes (Signed)
Pt mother spoke with pt husband on the phone. Per pt husband pt "took benadryl, melatonin and suboxone." MD Malinda in rm and is aware or what pt took.

## 2020-06-13 NOTE — Progress Notes (Signed)
Patient very drowsy and unsteady sitting in chair. Patient refusing to allow RN and NT to assist with helping her to bed or recliner chair. Security called to assist with patient's noncompliance. RN and NT in the presence of Security asked patient to allow [place] to grab her hand. Patient jerked hand away and cursed at staff still refusing to comply. Very concerned patient will fall out of chair.   Madie Reno, RN

## 2020-06-13 NOTE — ED Notes (Signed)
Pt provided orange juice at this time d/t CBG

## 2020-06-13 NOTE — ED Notes (Signed)
Dr Niu at bedside 

## 2020-06-13 NOTE — Progress Notes (Signed)
Patient refusing IV fluids and cardiac monitoring. MD aware.   Madie Reno, RN

## 2020-06-13 NOTE — ED Provider Notes (Addendum)
CT of the head is negative patient has been hallucinating but is now sedated.  We will admit her as planned medically.  She is IVCed.   Arnaldo Natal, MD 06/13/20 5883    Arnaldo Natal, MD 06/13/20 [phone] Patient's mother says she also takes Suboxone 3 times a day I am not sure of the dosage   Arnaldo Natal, MD 06/13/20 [phone]

## 2020-06-13 NOTE — ED Notes (Signed)
Pts mother at bedside who reports pt lives with her and was last seen at baseline at 5. At 0230, pts husband woke mother up with concern that pt was having seizure. Mother reports seeing pts eyes twitching and rolling backwards.

## 2020-06-14 DIAGNOSIS — G9341 Metabolic encephalopathy: Secondary | ICD-10-CM

## 2020-06-14 DIAGNOSIS — T450X1A Poisoning by antiallergic and antiemetic drugs, accidental (unintentional), initial encounter: Secondary | ICD-10-CM | POA: Diagnosis not present

## 2020-06-14 DIAGNOSIS — N3000 Acute cystitis without hematuria: Secondary | ICD-10-CM

## 2020-06-14 DIAGNOSIS — T50904A Poisoning by unspecified drugs, medicaments and biological substances, undetermined, initial encounter: Secondary | ICD-10-CM | POA: Diagnosis not present

## 2020-06-14 LAB — CBC
HCT: 43 % (ref 36.0–46.0)
Hemoglobin: 14.3 g/dL (ref 12.0–15.0)
MCH: 29.6 pg (ref 26.0–34.0)
MCHC: 33.3 g/dL (ref 30.0–36.0)
MCV: 89 fL (ref 80.0–100.0)
Platelets: 308 10*3/uL (ref 150–400)
RBC: 4.83 MIL/uL (ref 3.87–5.11)
RDW: 13.6 % (ref 11.5–15.5)
WBC: 9.4 10*3/uL (ref 4.0–10.5)
nRBC: 0 % (ref 0.0–0.2)

## 2020-06-14 LAB — BASIC METABOLIC PANEL
Anion gap: 15 (ref 5–15)
BUN: 16 mg/dL (ref 6–20)
CO2: 16 mmol/L — ABNORMAL LOW (ref 22–32)
Calcium: 9.5 mg/dL (ref 8.9–10.3)
Chloride: 106 mmol/L (ref 98–111)
Creatinine, Ser: 0.78 mg/dL (ref 0.44–1.00)
GFR calc Af Amer: >60 mL/min (ref >60)
GFR calc non Af Amer: >60 mL/min (ref >60)
Glucose, Bld: 95 mg/dL (ref 70–99)
Potassium: 4.7 mmol/L (ref 3.5–5.1)
Sodium: 137 mmol/L (ref 135–145)

## 2020-06-14 LAB — GLUCOSE, CAPILLARY
Glucose-Capillary: 87 mg/dL (ref 70–99)
Glucose-Capillary: 148 mg/dL — ABNORMAL HIGH (ref 70–99)

## 2020-06-14 LAB — HIV ANTIBODY (ROUTINE TESTING W REFLEX): HIV Screen 4th Generation wRfx: NONREACTIVE

## 2020-06-14 MED ORDER — SODIUM CHLORIDE 0.9 % IV SOLN: INTRAVENOUS | Status: DC | PRN

## 2020-06-14 MED ORDER — FOSFOMYCIN TROMETHAMINE 3 G PO PACK
3.0000 g | PACK | Freq: Once | ORAL | Status: AC
  Administered 2020-06-14: 3 g via ORAL
  Filled 2020-06-14: qty 3

## 2020-06-14 NOTE — Final Progress Note (Signed)
Physician Final Progress Note  Patient ID: Stephanie Barry MRN: [id number] DOB/AGE: [birth year] 39 y.o.  Admit date: 06/13/2020 Admitting provider: Lorretta Harp, MD Discharge date: 06/14/2020   Admission Diagnoses:    Major depression severe recuirrent Generalized anxiety  Chronic Pain  Polysub dependence, mainly opioids Adjustment disorder mixed emotions and conduct   \   Discharge Diagnoses:  Principal Problem:   Overdose Active Problems:   Hypothyroidism   Polysubstance abuse (HCC)   Diabetes mellitus without complication (HCC)   UTI (urinary tract infection)   Acute metabolic encephalopathy   HTN (hypertension)   Depression    Same as above   Consults:  TTS   Psych MD  IM MD ER MD   Significant Findings/ Diagnostic Studies:   None major   Procedures:  None major   Discharge Condition:  As st able as possible   Disposition:   Home    Patient was seen in ER and today by psych S/P benadryl intake where she was trying to sleep rather than harm self.  Admitted for obs and initially was on IVC --now rescinded after saying she is stable and needs to get back to kids and husband   No active SI HI or plans contracts for safety  Does not seek new psych meds now   Already has Suboxone clinic appointment and refills at home   Strange and odd at baseline but cooperative Pleasant Oriented to person place date and time Consciousness not clouded or fluctuant Mood fairly normal affect okay not anxious Thought process and content --no active Psychosis or mania Movements --no tics shakes or tremors Speech normal rate tone volume fluency Eye contact and rapport okay Judgement insight reliability fair  Concentration and attention okay  Si and HI contracts for safety Memory remote recent and immediate okay fund of knowledge, intelligence normal  Abstraction okay   ADL's normal Cognition okay Recall Okay Leans normal Akathisia none Musculoskeletal and gait and  station okay Language normal Handedness not known   A/P Stable for discharge contracts for safety  Husband and all to pick up   IM notified             Diet:  As tolerated  Discharge Activity: {  Suggest women's groups, AA NA related groups online Self Help ----church service work   [employer] mental health support   Discharge Instructions    Diet - low sodium heart healthy   Complete by: As directed    Increase activity slowly   Complete by: As directed      Allergies as of 06/14/2020   No Known Allergies     Medication List    STOP taking these medications   Bactrim DS 800-160 MG tablet Generic drug: sulfamethoxazole-trimethoprim     TAKE these medications   Buprenorphine HCl-Naloxone HCl 8-2 MG Film Place under the tongue 3 (three) times daily.   cetirizine 10 MG tablet Commonly known as: ZYRTEC Take 10 mg by mouth daily.   gabapentin 800 MG tablet Commonly known as: NEURONTIN Take 800 mg by mouth 4 (four) times daily.   Jardiance 25 MG Tabs tablet Generic drug: empagliflozin Take 25 mg by mouth daily.   levothyroxine 100 MCG tablet Commonly known as: SYNTHROID Take 100 mcg by mouth daily.   lisinopril 2.5 MG tablet Commonly known as: ZESTRIL Take 2.5 mg by mouth daily.   metFORMIN 1000 MG tablet Commonly known as: GLUCOPHAGE Take 1,000 mg by mouth 2 (two) times daily.   mirtazapine 15  MG disintegrating tablet Commonly known as: REMERON SOL-TAB Take 15 mg by mouth at bedtime.   mirtazapine 30 MG disintegrating tablet Commonly known as: REMERON SOL-TAB Take 30 mg by mouth at bedtime.   naloxone 0.4 MG/ML injection Commonly known as: NARCAN Inject into the muscle.   omeprazole 20 MG capsule Commonly known as: PRILOSEC Take 20 mg by mouth daily.   venlafaxine XR 75 MG 24 hr capsule Commonly known as: EFFEXOR-XR Take 225 mg by mouth daily.   Victoza 18 MG/3ML Sopn Generic drug: liraglutide Inject 1.2 mg into the skin daily.        Follow-up Information    Health, [facility] Dept Personal Follow up in 1 week(s).   Contact information: [address] [place] [address] [phone]               Total time spent taking care of this patient: ---30-40  minutes  Signed: Roselind Messier 06/14/2020, 12:43 PM

## 2020-06-14 NOTE — Progress Notes (Signed)
Stephanie Barry and Stephanie Barry x4. VSS. Pt tolerating diet well. No complaints of nausea or vomiting. IV removed intact, prescriptions given. Pt voices understanding of discharge instructions with no further questions. Patient discharged, wanted to walk down with husband  Allergies as of 06/14/2020   No Known Allergies     Medication List    STOP taking these medications   Bactrim DS 800-160 MG tablet Generic drug: sulfamethoxazole-trimethoprim     TAKE these medications   Buprenorphine HCl-Naloxone HCl 8-2 MG Film Place under the tongue 3 (three) times daily.   cetirizine 10 MG tablet Commonly known as: ZYRTEC Take 10 mg by mouth daily.   gabapentin 800 MG tablet Commonly known as: NEURONTIN Take 800 mg by mouth 4 (four) times daily.   Jardiance 25 MG Tabs tablet Generic drug: empagliflozin Take 25 mg by mouth daily.   levothyroxine 100 MCG tablet Commonly known as: SYNTHROID Take 100 mcg by mouth daily.   lisinopril 2.5 MG tablet Commonly known as: ZESTRIL Take 2.5 mg by mouth daily.   metFORMIN 1000 MG tablet Commonly known as: GLUCOPHAGE Take 1,000 mg by mouth 2 (two) times daily.   mirtazapine 15 MG disintegrating tablet Commonly known as: REMERON SOL-TAB Take 15 mg by mouth at bedtime.   mirtazapine 30 MG disintegrating tablet Commonly known as: REMERON SOL-TAB Take 30 mg by mouth at bedtime.   naloxone 0.4 MG/ML injection Commonly known as: NARCAN Inject into the muscle.   omeprazole 20 MG capsule Commonly known as: PRILOSEC Take 20 mg by mouth daily.   venlafaxine XR 75 MG 24 hr capsule Commonly known as: EFFEXOR-XR Take 225 mg by mouth daily.   Victoza 18 MG/3ML Sopn Generic drug: liraglutide Inject 1.2 mg into the skin daily.       Vitals:   06/14/20 0850 06/14/20 1152  BP: 112/79 (!) 96/59  Pulse:  84  Resp:  16  Temp:  98.6 F (37 Stephanie)  SpO2:  98%    Stephanie Barry Stephanie Barry

## 2020-06-14 NOTE — Discharge Summary (Signed)
Physician Discharge Summary  Patient ID: Stephanie Barry MRN: [id number] DOB/AGE: April 10, 1981 38 y.o.  Admit date: 06/13/2020 Discharge date: 06/14/2020  Admission Diagnoses:  Discharge Diagnoses:  Principal Problem:   Overdose Active Problems:   Hypothyroidism   Polysubstance abuse (HCC)   Diabetes mellitus without complication (HCC)   UTI (urinary tract infection)   Acute metabolic encephalopathy   HTN (hypertension)   Depression   Discharged Condition: good  Hospital Course:  Stephanie Barry is a 39 y.o. female with medical history significant of hypertension, diabetes mellitus, GERD, hypothyroidism, depression, HCV, cannabis abuse, suicidal ideation, opiate use disorder, PTSD, who presents with overdose and altered mental status.  She had some confusion at time of admission.  She was monitored overnight.  Her condition has improved, currently she is alert oriented to time place and person.  She has been seen by Dr. Smith Robert in the emergency room and again today.  She is safe to be discharged home with close follow-ups.  Patient also has a symptoms suggest UTI, no evidence of pyelonephritis.  No evidence of sepsis.  At this time, she will be given 1 dose of fosfomycin before discharge.  No additional need for antibiotics after that.    Consults: psychiatry  Significant Diagnostic Studies:     Treatments: Rocephin  Discharge Exam: Blood pressure (!) 96/59, pulse 84, temperature 98.6 F (37 C), resp. rate 16, SpO2 98 %. General appearance: alert and cooperative Resp: clear to auscultation bilaterally Cardio: regular rate and rhythm, S1, S2 normal, no murmur, click, rub or gallop GI: soft, non-tender; bowel sounds normal; no masses,  no organomegaly Extremities: extremities normal, atraumatic, no cyanosis or edema  Disposition: Discharge disposition: 01-Home or Self Care       Discharge Instructions    Diet - low sodium heart healthy   Complete by: As directed    Increase  activity slowly   Complete by: As directed      Allergies as of 06/14/2020   No Known Allergies     Medication List    STOP taking these medications   Bactrim DS 800-160 MG tablet Generic drug: sulfamethoxazole-trimethoprim     TAKE these medications   Buprenorphine HCl-Naloxone HCl 8-2 MG Film Place under the tongue 3 (three) times daily.   cetirizine 10 MG tablet Commonly known as: ZYRTEC Take 10 mg by mouth daily.   gabapentin 800 MG tablet Commonly known as: NEURONTIN Take 800 mg by mouth 4 (four) times daily.   Jardiance 25 MG Tabs tablet Generic drug: empagliflozin Take 25 mg by mouth daily.   levothyroxine 100 MCG tablet Commonly known as: SYNTHROID Take 100 mcg by mouth daily.   lisinopril 2.5 MG tablet Commonly known as: ZESTRIL Take 2.5 mg by mouth daily.   metFORMIN 1000 MG tablet Commonly known as: GLUCOPHAGE Take 1,000 mg by mouth 2 (two) times daily.   mirtazapine 15 MG disintegrating tablet Commonly known as: REMERON SOL-TAB Take 15 mg by mouth at bedtime.   mirtazapine 30 MG disintegrating tablet Commonly known as: REMERON SOL-TAB Take 30 mg by mouth at bedtime.   naloxone 0.4 MG/ML injection Commonly known as: NARCAN Inject into the muscle.   omeprazole 20 MG capsule Commonly known as: PRILOSEC Take 20 mg by mouth daily.   venlafaxine XR 75 MG 24 hr capsule Commonly known as: EFFEXOR-XR Take 225 mg by mouth daily.   Victoza 18 MG/3ML Sopn Generic drug: liraglutide Inject 1.2 mg into the skin daily.       Follow-up  Information    Health, [facility] Dept Personal Follow up in 1 week(s).   Contact information: [address] [place] [address] [phone]               Signed: Marrion Coy 06/14/2020, 12:43 PM

## 2020-06-14 NOTE — TOC Initial Note (Signed)
Transition of Care [facility]) - Initial/Assessment Note    Patient Details  Name: Stephanie Barry MRN: [id number] Date of Birth: [birth year]  [facility] of [facility] CM/SW Contact:    Joseph Art, LCSWA Phone Number: 06/14/2020, 10:50 AM  Clinical Narrative:                  Patient presents to [facility] due to concerns of overdose. Patient currently IVC, pending continued psych assessment, more collateral information needed. Patient has a history of depression.  Patient has been agitated and uncooperative.  TOC will follow.  Expected Discharge Plan: Home/Self Care Barriers to Discharge: Family Issues, Continued Medical Work up, Barriers Unresolved (comment) (Patietn IVC, psych observation, psych assessment pending)   Patient Goals and CMS Choice        Expected Discharge Plan and Services Expected Discharge Plan: Home/Self Care In-house Referral: Clinical Social Work     Living arrangements for the past 2 months: Single Family Home                                      Prior Living Arrangements/Services Living arrangements for the past 2 months: Single Family Home Lives with:: Minor Children Patient language and need for interpreter reviewed:: Yes        Need for Family Participation in Patient Care: Yes (Comment) Care giver support system in place?: Yes (comment)   Criminal Activity/Legal Involvement Pertinent to Current Situation/Hospitalization: No - Comment as needed  Activities of Daily Living Home Assistive Devices/Equipment: None ADL Screening (condition at time of admission) Patient's cognitive ability adequate to safely complete daily activities?: Yes Is the patient deaf or have difficulty hearing?: No Does the patient have difficulty seeing, even when wearing glasses/contacts?: No Does the patient have difficulty concentrating, remembering, or making decisions?: No Patient able to express need for assistance with ADLs?: Yes Does the patient have difficulty  dressing or bathing?: No Independently performs ADLs?: Yes (appropriate for developmental age) Does the patient have difficulty walking or climbing stairs?: No Weakness of Legs: None Weakness of Arms/Hands: None  Permission Sought/Granted Permission sought to share information with : Family Supports    Share Information with NAME: Domenic Moras (Spouse) [phone]     Permission granted to share info w Relationship: spouse     Emotional Assessment Appearance:: Appears stated age Attitude/Demeanor/Rapport: Uncooperative Affect (typically observed): Agitated, Anxious Orientation: : Oriented to Self, Oriented to Place, Oriented to  Time, Oriented to Situation Alcohol / Substance Use: Other (comment) (Possible overdose) Psych Involvement: Yes (comment) (Patient IVC on psych hold, assessment pending, needing collateral information)  Admission diagnosis:  Marijuana abuse [F12.10] Overdose [T50.901A] Cocaine abuse (HCC) [F14.10] Drug overdose, undetermined intent, initial encounter [T50.904A] Urinary tract infection without hematuria, site unspecified [N39.0] Patient Active Problem List   Diagnosis Date Noted  . Polysubstance abuse (HCC) 06/13/2020  . HTN (hypertension) 06/13/2020  . Depression 06/13/2020  . Diabetes mellitus without complication (HCC)   . UTI (urinary tract infection)   . Acute metabolic encephalopathy   . Overdose   . Cocaine abuse (HCC)   . Suicidal ideation 04/08/2019  . Aggression 04/08/2019  . Cannabis withdrawal (HCC) 04/08/2019  . Cannabis abuse 03/29/2016  . Substance induced mood disorder (HCC) 03/29/2016  . Disruptive mood dysregulation disorder (HCC)   . Hypothyroidism 07/28/2015  . Cannabis use disorder, moderate, dependence (HCC) 07/28/2015  . Diabetes (HCC) 07/28/2015  . Hepatitis C 07/28/2015  .  Major depressive disorder, single episode, severe without psychotic features (HCC) 07/28/2015  . Opioid use disorder, severe, in early remission (HCC)  07/28/2015   PCP:  Health, [facility] Dept Personal Pharmacy:   [facility] [address], [place] - [address] [place] [address] Phone: [phone] Fax: [phone]  [id number], [place] - [address] AT [facility] [address] [facility] [place] [address] Phone: [phone] Fax: [phone]     Social Determinants of Health (SDOH) Interventions    Readmission Risk Interventions No flowsheet data found.

## 2020-06-16 LAB — URINE CULTURE: Culture: >=100000 — AB

## 2020-06-18 LAB — CULTURE, BLOOD (ROUTINE X 2)
Culture: NO GROWTH
Culture: NO GROWTH
Special Requests: ADEQUATE

## 2020-09-05 ENCOUNTER — Encounter: Payer: Self-pay | Admitting: Internal Medicine

## 2020-09-05 ENCOUNTER — Emergency Department: Payer: Medicare Other

## 2020-09-05 ENCOUNTER — Other Ambulatory Visit: Payer: Self-pay

## 2020-09-05 ENCOUNTER — Inpatient Hospital Stay
Admission: EM | Admit: 2020-09-05 | Discharge: 2020-09-07 | DRG: 918 | Disposition: A | Discharge status: Other Acute Inpatient Hospital | Payer: Medicare Other | Attending: Internal Medicine | Admitting: Internal Medicine

## 2020-09-05 DIAGNOSIS — F1410 Cocaine abuse, uncomplicated: Secondary | ICD-10-CM | POA: Diagnosis present

## 2020-09-05 DIAGNOSIS — E1142 Type 2 diabetes mellitus with diabetic polyneuropathy: Secondary | ICD-10-CM | POA: Diagnosis present

## 2020-09-05 DIAGNOSIS — E119 Type 2 diabetes mellitus without complications: Secondary | ICD-10-CM

## 2020-09-05 DIAGNOSIS — T50902A Poisoning by unspecified drugs, medicaments and biological substances, intentional self-harm, initial encounter: Principal | ICD-10-CM

## 2020-09-05 DIAGNOSIS — E876 Hypokalemia: Secondary | ICD-10-CM | POA: Diagnosis present

## 2020-09-05 DIAGNOSIS — B1920 Unspecified viral hepatitis C without hepatic coma: Secondary | ICD-10-CM | POA: Diagnosis present

## 2020-09-05 DIAGNOSIS — E039 Hypothyroidism, unspecified: Secondary | ICD-10-CM | POA: Diagnosis present

## 2020-09-05 DIAGNOSIS — F09 Unspecified mental disorder due to known physiological condition: Secondary | ICD-10-CM | POA: Diagnosis present

## 2020-09-05 DIAGNOSIS — Z20822 Contact with and (suspected) exposure to covid-19: Secondary | ICD-10-CM | POA: Diagnosis present

## 2020-09-05 DIAGNOSIS — F515 Nightmare disorder: Secondary | ICD-10-CM | POA: Diagnosis present

## 2020-09-05 DIAGNOSIS — F1220 Cannabis dependence, uncomplicated: Secondary | ICD-10-CM | POA: Diagnosis present

## 2020-09-05 DIAGNOSIS — I10 Essential (primary) hypertension: Secondary | ICD-10-CM | POA: Diagnosis not present

## 2020-09-05 DIAGNOSIS — K219 Gastro-esophageal reflux disease without esophagitis: Secondary | ICD-10-CM | POA: Diagnosis present

## 2020-09-05 DIAGNOSIS — T50904A Poisoning by unspecified drugs, medicaments and biological substances, undetermined, initial encounter: Secondary | ICD-10-CM | POA: Diagnosis not present

## 2020-09-05 DIAGNOSIS — Z79899 Other long term (current) drug therapy: Secondary | ICD-10-CM

## 2020-09-05 DIAGNOSIS — F1910 Other psychoactive substance abuse, uncomplicated: Secondary | ICD-10-CM | POA: Diagnosis present

## 2020-09-05 DIAGNOSIS — T450X2A Poisoning by antiallergic and antiemetic drugs, intentional self-harm, initial encounter: Secondary | ICD-10-CM | POA: Diagnosis not present

## 2020-09-05 DIAGNOSIS — F319 Bipolar disorder, unspecified: Secondary | ICD-10-CM

## 2020-09-05 DIAGNOSIS — Z833 Family history of diabetes mellitus: Secondary | ICD-10-CM

## 2020-09-05 DIAGNOSIS — Z7984 Long term (current) use of oral hypoglycemic drugs: Secondary | ICD-10-CM

## 2020-09-05 DIAGNOSIS — Z79890 Hormone replacement therapy (postmenopausal): Secondary | ICD-10-CM

## 2020-09-05 DIAGNOSIS — F1121 Opioid dependence, in remission: Secondary | ICD-10-CM | POA: Diagnosis present

## 2020-09-05 DIAGNOSIS — R451 Restlessness and agitation: Secondary | ICD-10-CM | POA: Diagnosis present

## 2020-09-05 DIAGNOSIS — R443 Hallucinations, unspecified: Secondary | ICD-10-CM | POA: Diagnosis present

## 2020-09-05 DIAGNOSIS — E871 Hypo-osmolality and hyponatremia: Secondary | ICD-10-CM | POA: Diagnosis not present

## 2020-09-05 DIAGNOSIS — F419 Anxiety disorder, unspecified: Secondary | ICD-10-CM | POA: Diagnosis present

## 2020-09-05 DIAGNOSIS — F3130 Bipolar disorder, current episode depressed, mild or moderate severity, unspecified: Secondary | ICD-10-CM | POA: Diagnosis present

## 2020-09-05 DIAGNOSIS — E1165 Type 2 diabetes mellitus with hyperglycemia: Secondary | ICD-10-CM | POA: Diagnosis present

## 2020-09-05 DIAGNOSIS — Z781 Physical restraint status: Secondary | ICD-10-CM

## 2020-09-05 DIAGNOSIS — T50901A Poisoning by unspecified drugs, medicaments and biological substances, accidental (unintentional), initial encounter: Secondary | ICD-10-CM | POA: Diagnosis present

## 2020-09-05 DIAGNOSIS — F32A Depression, unspecified: Secondary | ICD-10-CM | POA: Diagnosis present

## 2020-09-05 DIAGNOSIS — G8929 Other chronic pain: Secondary | ICD-10-CM | POA: Diagnosis present

## 2020-09-05 DIAGNOSIS — Z87891 Personal history of nicotine dependence: Secondary | ICD-10-CM

## 2020-09-05 LAB — CBC
HCT: 32.2 % — ABNORMAL LOW (ref 36.0–46.0)
Hemoglobin: 10 g/dL — ABNORMAL LOW (ref 12.0–15.0)
MCH: 26.7 pg (ref 26.0–34.0)
MCHC: 31.1 g/dL (ref 30.0–36.0)
MCV: 85.9 fL (ref 80.0–100.0)
Platelets: 206 10*3/uL (ref 150–400)
RBC: 3.75 MIL/uL — ABNORMAL LOW (ref 3.87–5.11)
RDW: 17 % — ABNORMAL HIGH (ref 11.5–15.5)
WBC: 10.7 10*3/uL — ABNORMAL HIGH (ref 4.0–10.5)
nRBC: 0 % (ref 0.0–0.2)

## 2020-09-05 LAB — COMPREHENSIVE METABOLIC PANEL
ALT: 14 U/L (ref 0–44)
AST: 23 U/L (ref 15–41)
Albumin: 4.6 g/dL (ref 3.5–5.0)
Alkaline Phosphatase: 53 U/L (ref 38–126)
Anion gap: 12 (ref 5–15)
BUN: 13 mg/dL (ref 6–20)
CO2: 24 mmol/L (ref 22–32)
Calcium: 9.8 mg/dL (ref 8.9–10.3)
Chloride: 98 mmol/L (ref 98–111)
Creatinine, Ser: 0.79 mg/dL (ref 0.44–1.00)
GFR, Estimated: >60 mL/min (ref >60)
Glucose, Bld: 137 mg/dL — ABNORMAL HIGH (ref 70–99)
Potassium: 3.2 mmol/L — ABNORMAL LOW (ref 3.5–5.1)
Sodium: 134 mmol/L — ABNORMAL LOW (ref 135–145)
Total Bilirubin: 0.6 mg/dL (ref 0.3–1.2)
Total Protein: 8.8 g/dL — ABNORMAL HIGH (ref 6.5–8.1)

## 2020-09-05 LAB — URINE DRUG SCREEN, QUALITATIVE (ARMC ONLY)
Amphetamines, Ur Screen: NOT DETECTED
Barbiturates, Ur Screen: NOT DETECTED
Benzodiazepine, Ur Scrn: POSITIVE — AB
Cannabinoid 50 Ng, Ur ~~LOC~~: POSITIVE — AB
Cocaine Metabolite,Ur ~~LOC~~: POSITIVE — AB
MDMA (Ecstasy)Ur Screen: NOT DETECTED
Methadone Scn, Ur: NOT DETECTED
Opiate, Ur Screen: NOT DETECTED
Phencyclidine (PCP) Ur S: NOT DETECTED
Tricyclic, Ur Screen: POSITIVE — AB

## 2020-09-05 LAB — CK: Total CK: 189 U/L (ref 38–234)

## 2020-09-05 LAB — MAGNESIUM: Magnesium: 2 mg/dL (ref 1.7–2.4)

## 2020-09-05 LAB — SALICYLATE LEVEL: Salicylate Lvl: <7 mg/dL — ABNORMAL LOW (ref 7.0–30.0)

## 2020-09-05 LAB — ETHANOL: Alcohol, Ethyl (B): <10 mg/dL (ref <10)

## 2020-09-05 LAB — GLUCOSE, CAPILLARY
Glucose-Capillary: 129 mg/dL — ABNORMAL HIGH (ref 70–99)
Glucose-Capillary: 74 mg/dL (ref 70–99)

## 2020-09-05 LAB — RESPIRATORY PANEL BY RT PCR (FLU A&B, COVID)
Influenza A by PCR: NEGATIVE
Influenza B by PCR: NEGATIVE
SARS Coronavirus 2 by RT PCR: NEGATIVE

## 2020-09-05 LAB — ACETAMINOPHEN LEVEL: Acetaminophen (Tylenol), Serum: <10 ug/mL — ABNORMAL LOW (ref 10–30)

## 2020-09-05 LAB — PREGNANCY, URINE: Preg Test, Ur: NEGATIVE

## 2020-09-05 MED ORDER — LEVOTHYROXINE SODIUM 100 MCG PO TABS
100.0000 ug | ORAL_TABLET | Freq: Every day | ORAL | Status: DC
  Administered 2020-09-06 – 2020-09-07 (×2): 100 ug via ORAL
  Filled 2020-09-05 (×2): qty 1

## 2020-09-05 MED ORDER — HYDRALAZINE HCL 20 MG/ML IJ SOLN: 5.0000 mg | INTRAMUSCULAR | Status: DC | PRN

## 2020-09-05 MED ORDER — SODIUM CHLORIDE 0.9 % IV BOLUS
1000.0000 mL | Freq: Once | INTRAVENOUS | Status: AC
  Administered 2020-09-05: 1000 mL via INTRAVENOUS

## 2020-09-05 MED ORDER — ONDANSETRON HCL 4 MG/2ML IJ SOLN: 4.0000 mg | Freq: Three times a day (TID) | INTRAMUSCULAR | Status: DC | PRN

## 2020-09-05 MED ORDER — INSULIN ASPART 100 UNIT/ML ~~LOC~~ SOLN: 0.0000 [IU] | Freq: Every day | SUBCUTANEOUS | Status: DC

## 2020-09-05 MED ORDER — INSULIN ASPART 100 UNIT/ML ~~LOC~~ SOLN
0.0000 [IU] | Freq: Three times a day (TID) | SUBCUTANEOUS | Status: DC
  Administered 2020-09-06: 2 [IU] via SUBCUTANEOUS
  Administered 2020-09-06: 3 [IU] via SUBCUTANEOUS
  Administered 2020-09-06: 5 [IU] via SUBCUTANEOUS
  Administered 2020-09-07: 7 [IU] via SUBCUTANEOUS
  Administered 2020-09-07: 5 [IU] via SUBCUTANEOUS
  Filled 2020-09-05 (×7): qty 1

## 2020-09-05 MED ORDER — CHLORHEXIDINE GLUCONATE CLOTH 2 % EX PADS
6.0000 | MEDICATED_PAD | Freq: Every day | CUTANEOUS | Status: DC
  Administered 2020-09-06 – 2020-09-07 (×2): 6 via TOPICAL

## 2020-09-05 MED ORDER — ZIPRASIDONE MESYLATE 20 MG IM SOLR
20.0000 mg | Freq: Once | INTRAMUSCULAR | Status: AC
  Administered 2020-09-05: 20 mg via INTRAMUSCULAR

## 2020-09-05 MED ORDER — POTASSIUM CHLORIDE 10 MEQ/100ML IV SOLN
10.0000 meq | Freq: Once | INTRAVENOUS | Status: DC
  Filled 2020-09-05: qty 100

## 2020-09-05 MED ORDER — ACETAMINOPHEN 650 MG RE SUPP: 650.0000 mg | Freq: Four times a day (QID) | RECTAL | Status: DC | PRN

## 2020-09-05 MED ORDER — ACETAMINOPHEN 325 MG PO TABS: 650.0000 mg | ORAL_TABLET | Freq: Four times a day (QID) | ORAL | Status: DC | PRN

## 2020-09-05 MED ORDER — LISINOPRIL 5 MG PO TABS
2.5000 mg | ORAL_TABLET | Freq: Every day | ORAL | Status: DC
  Administered 2020-09-06 – 2020-09-07 (×2): 2.5 mg via ORAL
  Filled 2020-09-05 (×2): qty 1

## 2020-09-05 MED ORDER — ENOXAPARIN SODIUM 40 MG/0.4ML ~~LOC~~ SOLN
40.0000 mg | SUBCUTANEOUS | Status: DC
  Administered 2020-09-06 – 2020-09-07 (×2): 40 mg via SUBCUTANEOUS
  Filled 2020-09-05: qty 0.4

## 2020-09-05 MED ORDER — ACETAMINOPHEN 325 MG PO TABS
650.0000 mg | ORAL_TABLET | Freq: Four times a day (QID) | ORAL | Status: DC | PRN
  Administered 2020-09-06: 650 mg via ORAL
  Filled 2020-09-05: qty 2

## 2020-09-05 MED ORDER — POTASSIUM CHLORIDE CRYS ER 20 MEQ PO TBCR
20.0000 meq | EXTENDED_RELEASE_TABLET | Freq: Once | ORAL | Status: AC
  Administered 2020-09-05: 20 meq via ORAL
  Filled 2020-09-05: qty 1

## 2020-09-05 MED ORDER — PANTOPRAZOLE SODIUM 40 MG PO TBEC
40.0000 mg | DELAYED_RELEASE_TABLET | Freq: Every day | ORAL | Status: DC
  Administered 2020-09-06 – 2020-09-07 (×2): 40 mg via ORAL
  Filled 2020-09-05 (×2): qty 1

## 2020-09-05 MED ORDER — LORAZEPAM 2 MG/ML IJ SOLN
1.0000 mg | Freq: Once | INTRAMUSCULAR | Status: AC
  Administered 2020-09-05: 1 mg via INTRAVENOUS
  Filled 2020-09-05: qty 1

## 2020-09-05 MED ORDER — ALBUTEROL SULFATE (2.5 MG/3ML) 0.083% IN NEBU: 2.5000 mg | INHALATION_SOLUTION | RESPIRATORY_TRACT | Status: DC | PRN

## 2020-09-05 MED ORDER — ONDANSETRON HCL 4 MG/2ML IJ SOLN
4.0000 mg | Freq: Once | INTRAMUSCULAR | Status: AC
  Administered 2020-09-05: 4 mg via INTRAVENOUS
  Filled 2020-09-05: qty 2

## 2020-09-05 MED ORDER — BUPRENORPHINE HCL-NALOXONE HCL 8-2 MG SL SUBL: 1.0000 | SUBLINGUAL_TABLET | Freq: Three times a day (TID) | SUBLINGUAL | Status: DC

## 2020-09-05 MED ORDER — POTASSIUM CHLORIDE CRYS ER 20 MEQ PO TBCR
40.0000 meq | EXTENDED_RELEASE_TABLET | Freq: Once | ORAL | Status: AC
  Administered 2020-09-05: 40 meq via ORAL
  Filled 2020-09-05: qty 2

## 2020-09-05 NOTE — ED Notes (Signed)
Pt currently resting at this time and is still occasionally trying to get out of bed with restraints on. Sitter at bedside. Husband called for an update, husband updated on plan of care and current restraint status. Husband states "thanks for keeping her safe".

## 2020-09-05 NOTE — ED Notes (Signed)
Pt in and out cathed by this RN and Alissa NT. Sterility maintained throughout procedure.  Urine sample sent to lab.

## 2020-09-05 NOTE — ED Provider Notes (Signed)
[facility] Emergency Department Provider Note  Time seen: 2:44 AM  I have reviewed the triage vital signs and the nursing notes.   HISTORY  Chief Complaint Drug Overdose and Suicide Attempt   HPI Stephanie Barry is a 39 y.o. female with a past medical history of diabetes, hepatitis, substance abuse, presents to the emergency department after a reported overdose.  According to report parents brought the patient in after she took approximately 100 tablets of Benadryl.  Per nurse there was vomit in the car had a significant amount of Benadryl tablets within it.  Upon arrival patient is extremely somnolent will awaken to loud voice but is unable to answer questions or follow commands appears quite confused and then falls back asleep.  Unable to obtain any further history or review of systems at this time.  Awaiting parents arrival.   Past Medical History:  Diagnosis Date  . Diabetes mellitus without complication (HCC)   . Hepatitis C   . Thyroid disease     Patient Active Problem List   Diagnosis Date Noted  . Polysubstance abuse (HCC) 06/13/2020  . HTN (hypertension) 06/13/2020  . Depression 06/13/2020  . Diabetes mellitus without complication (HCC)   . UTI (urinary tract infection)   . Acute metabolic encephalopathy   . Overdose   . Cocaine abuse (HCC)   . Suicidal ideation 04/08/2019  . Aggression 04/08/2019  . Cannabis withdrawal (HCC) 04/08/2019  . Cannabis abuse 03/29/2016  . Substance induced mood disorder (HCC) 03/29/2016  . Disruptive mood dysregulation disorder (HCC)   . Hypothyroidism 07/28/2015  . Cannabis use disorder, moderate, dependence (HCC) 07/28/2015  . Diabetes (HCC) 07/28/2015  . Hepatitis C 07/28/2015  . Major depressive disorder, single episode, severe without psychotic features (HCC) 07/28/2015  . Opioid use disorder, severe, in early remission (HCC) 07/28/2015    Past Surgical History:  Procedure Laterality Date  . TUBAL  LIGATION      Prior to Admission medications   Medication Sig Start Date End Date Taking? Authorizing Provider  Buprenorphine HCl-Naloxone HCl 8-2 MG FILM Place under the tongue 3 (three) times daily. 06/05/20   [provider]  cetirizine (ZYRTEC) 10 MG tablet Take 10 mg by mouth daily. 02/26/19   [provider]  gabapentin (NEURONTIN) 800 MG tablet Take 800 mg by mouth 4 (four) times daily. 04/05/19   [provider]  JARDIANCE 25 MG TABS tablet Take 25 mg by mouth daily. 02/12/19   [provider]  levothyroxine (SYNTHROID) 100 MCG tablet Take 100 mcg by mouth daily. 05/08/20   [provider]  lisinopril (ZESTRIL) 2.5 MG tablet Take 2.5 mg by mouth daily. 05/08/20   [provider]  metFORMIN (GLUCOPHAGE) 1000 MG tablet Take 1,000 mg by mouth 2 (two) times daily. 02/27/19   [provider]  mirtazapine (REMERON SOL-TAB) 15 MG disintegrating tablet Take 15 mg by mouth at bedtime.    [provider]  mirtazapine (REMERON SOL-TAB) 30 MG disintegrating tablet Take 30 mg by mouth at bedtime. 05/08/20   [provider]  naloxone [facility]) 0.4 MG/ML injection Inject into the muscle. 02/13/20   [provider]  omeprazole (PRILOSEC) 20 MG capsule Take 20 mg by mouth daily. 02/27/19   [provider]  venlafaxine XR (EFFEXOR-XR) 75 MG 24 hr capsule Take 225 mg by mouth daily. 02/26/19   [provider]  VICTOZA 18 MG/3ML SOPN Inject 1.2 mg into the skin daily. 05/08/20   [provider]  No Known Allergies  Family History  Problem Relation Age of Onset  . Diabetes Mellitus II Mother   . Diabetes Mellitus II Father     Social History Social History   Tobacco Use  . Smoking status: Former Games developer  . Smokeless tobacco: Never Used  Substance Use Topics  . Alcohol use: No  . Drug use: Yes    Types: Marijuana, Cocaine    Review of Systems Unable to obtain adequate/accurate review of  systems at this time secondary to altered mental status.  ____________________________________________   PHYSICAL EXAM:  Constitutional: Patient is somnolent will awaken to loud voice but is unable to answer questions or follow commands.  Appears confused. Eyes: 3 to 4 mm bilaterally. ENT      Head: Normocephalic and atraumatic.      Mouth/Throat: Dried vomitus on lips, dry appearing mucous membranes. Cardiovascular: Regular rhythm rate around 100 bpm.  No obvious murmur. Respiratory: Normal respiratory effort without tachypnea nor retractions. Breath sounds are clear  Gastrointestinal: Soft and nontender. No distention.   Musculoskeletal: Nontender with normal range of motion in all extremities.  Neurologic: Somnolent, does appear to move all extremities at times. Skin:  Skin is warm, dry and intact.  Psychiatric: Altered/confused, somnolent  ____________________________________________    EKG  EKG viewed and interpreted by myself shows sinus tachycardia 106 bpm with a narrow QRS, normal axis, normal intervals, no concerning ST changes.  ____________________________________________    RADIOLOGY  X-ray shows no acute findings.  ____________________________________________   INITIAL IMPRESSION / ASSESSMENT AND PLAN / ED COURSE  Pertinent labs & imaging results that were available during my care of the patient were reviewed by me and considered in my medical decision making (see chart for details).   Patient presents emergency department after possible intentional overdose of Benadryl.  No other substances known.  Awaiting family arrival for further details.  We will check labs including acetaminophen, salicylate and ethanol.  We will check a urine drug screen.  We will contact poison control, keep on cardiac monitoring and IV hydrate while awaiting results.  ----------------------------------------- 5:37 AM on 09/05/2020 -----------------------------------------  Patient  continues to be somnolent but does continue to awaken to loud voice, will look at you but then falls asleep before she is able to answer any questions or follow any commands. Patient's work-up is significant for urine toxicology positive for cocaine, benzodiazepines, cannabinoids and TCA.  Ethanol is negative.  Patient did desat to 88% placed on 2 L nasal cannula.  Highly suspect this is due to somnolence and decreased respiratory drive.  We will obtain a chest x-ray as a precaution.  ----------------------------------------- 6:48 AM on 09/05/2020 -----------------------------------------  Patient is now awake, keeps attempting to get out of bed.  States she is going home.  Patient is under an IVC.  We will keep the patient in the emergency department till she is medically cleared and then evaluated by psychiatry.  Poison control is requesting CK and magnesium levels which have been drawn and are largely normal.  We are repleting patient's potassium per poison control recommendations as well.  Patient is appearing much better at this time. Patient care signed out to oncoming physician.   Stephanie Barry was evaluated in Emergency Department on 09/05/2020 for the symptoms described in the history of present illness. She was evaluated in the context of the global COVID-19 pandemic, which necessitated consideration that the patient might be at risk for infection with the SARS-CoV-2 virus that causes COVID-19. Institutional protocols  and algorithms that pertain to the evaluation of patients at risk for COVID-19 are in a state of rapid change based on information released by regulatory bodies including the CDC and federal and state organizations. These policies and algorithms were followed during the patient's care in the ED.  ____________________________________________   FINAL CLINICAL IMPRESSION(S) / ED DIAGNOSES  Overdose   Minna Antis, MD 09/05/20 [phone]

## 2020-09-05 NOTE — ED Notes (Signed)
Pt was able to urinate in the bedpan, but then tried to remove it herself spilling most of it on her and the bed. Pt cleaned and linnens changed by this tech. Pt wanting to get out of bed but redirected by this tech back into bed.

## 2020-09-05 NOTE — ED Notes (Signed)
Pt's mouth cleaned by this RN. Pt attempting to cough. EDP bedside.

## 2020-09-05 NOTE — ED Notes (Signed)
Pt calm at this time, still trying to get out of bed with restraints on every now and then, pt able to be redirected to lay down. No distress noted at this time. Will continue 1:1 as sitter.
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
## 2020-09-05 NOTE — ED Notes (Signed)
Pt's O2 sats 100% on RA.

## 2020-09-05 NOTE — ED Provider Notes (Signed)
Patient reexamined, despite multiple doses of Ativan and Geodon is still trying to get out of bed although less vigorously than before.  I believe she will need to continue to have soft restraints for her safety and our staff safety.  I have admitted to the hospital staff given prolonged altered mental status   Jene Every, MD 09/05/20 1331

## 2020-09-05 NOTE — ED Notes (Signed)
Advised nurse that patient has assigned bed 
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
## 2020-09-05 NOTE — ED Notes (Signed)
Pt continues to try and stand and has legs off the side of the bed. This is causing her to complain of wrist pain as she pulls on the wrist restraints.   Pt has been asked and tried to be helped to be repositioned back into the bed. Pt does not allow staff to help her.   lw edt
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
## 2020-09-05 NOTE — ED Notes (Signed)
Pt had outburst of yelling and wanting to get up. Dr. Cyril Loosen came in to assure pt that she can not be taken out of restraints until she calms down. Pt verbally assaulting to staff.  Pt still very confused and angry about the restraints. The next minute pt lays back and is tearful talking about stories as if tech is apart of her life. Pt very out of touch with reality and is talking salad sentences.   lw edt
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
## 2020-09-05 NOTE — ED Notes (Signed)
When 3pm tech arrives for switch we will try and change pt bedding to get her dry to go to the ICU Unsure if measures will be reached as pt is very confused, still trying to get up and saying she wants to leave.   lw edt

## 2020-09-05 NOTE — ED Triage Notes (Signed)
Pt arrives to ED from home via PoV with parents who report pt took "over 100 benadryl". Pt arrives somnolent and non-verbal. Pt with evident pink gritty material in mouth.

## 2020-09-05 NOTE — Progress Notes (Signed)
Still belligerent and yelling at staff, demanding medication and food. Manuela Schwartz NP went to patient's room to try to reason with patient about medication. Patient demanded Suboxone and stated that Steward Drone did not know what she was talking about.

## 2020-09-05 NOTE — ED Notes (Signed)
Pt still agitated and trying to get out of bed at this time even with wrist restraints on. Sitter at bedside.
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
## 2020-09-05 NOTE — ED Notes (Signed)
Pt's mouth suctioned of particles of benadryl.

## 2020-09-05 NOTE — ED Provider Notes (Signed)
Patient is repeatedly trying to get out of bed despite status post efforts, will need to give 1 mg of Ativan for patient safety.   Jene Every, MD 09/05/20 [phone]

## 2020-09-05 NOTE — Progress Notes (Signed)
2 sandwich trays provided as well as juice and ginger ale. Patient states she has not eaten all day despite the ice cream, peanut butter and graham crackers I provided at the beginning of the shift when I removed her empty meal tray.

## 2020-09-05 NOTE — ED Notes (Signed)
Pt noted to be attempting to get OOB. Pt able to scoot self back into bed. Pt states "I am feeling better". Pt appears to be visually and audibly hallucinating.

## 2020-09-05 NOTE — ED Notes (Signed)
Pt assisted to bedpan at this time.

## 2020-09-05 NOTE — ED Notes (Signed)
IV team bedside. 

## 2020-09-05 NOTE — ED Notes (Signed)
Pt too agitated for tech to obtain vitals at this time. Pt tore bp cuff off and is trying to remove restraints to stand. Pt continues to think, see and speak to people that are not here. "Stephanie Barry was just in here. He brought me all my stuff"  lw edt

## 2020-09-05 NOTE — ED Notes (Signed)
Poison control called this RN for update on pt.

## 2020-09-05 NOTE — ED Notes (Signed)
Pt is currently still trying to get out of bed even with soft wrist restraints in place. Pt is less active than before but is still hallucinating and is considered to be a danger to herself and others at this time. CMS intact. This RN will attempt to release restraints as soon as it safe to do so. No distress noted at this time. Pt will be quietly resting for a moment then suddenly try and jump out of bed, pt is still not redirectable or able to reason with pt with no other means at this time despite chemical restraining attempted prior to physical restraining. Sitter at bedside.

## 2020-09-05 NOTE — H&P (Signed)
History and Physical    Stephanie Barry CVE:[id number] DOB: 10-Nov-1980 DOA: 09/05/2020  Referring MD/NP/PA:   PCP: [facility]   Patient coming from:  The patient is coming from home.  At baseline, pt is independent for most of ADL.        Chief Complaint: overdose and AMS  HPI: Stephanie Barry is a 39 y.o. female with medical history significant of hypertension, diabetes mellitus, GERD, hypothyroidism, depression, HCV, polysubstance abuse (opiates, cannabis, cocaine, benzo, cocaine), suicidal, who presents with overdose and altered mental status.  Per her mother (I called her mother by phone), pt likely took over 100 tablets of benadryl for suicidal attempt. Pt has altered mental status, confused, extremely somnolent at arrival to ED. Also has audible hallucination. Pt was found to have pink gritty material in mouth ED RN's note.   Per her mother, patient does not have chest pain, shortness breath or cough.  Patient has nausea and vomited few times, but no diarrhea or abdominal pain.  Not sure if patient has symptoms of UTI.  Patient moves all extremities.  No facial droop. Poison control was contacted by ED. They requested CK and magnesium levels with supportive care.  Pt's mental status has improved in the ED, but is still intermittently becomes very confused and not redirectable, and constantly trying to get out of the bed. She is oriented to place and knows her own name, but not oriented to time.  Patient denies any pain.  Denies chest pain or abdominal pain.  No active respiratory distress, active cough, nausea, vomiting or diarrhea noted. Pt is IVC'ed.   ED Course: pt was found to have WBC 10.7, CK 189, positive UDS (benzo, cocaine, cannabinoid, TCA), negative pregnancy test, Tylenol level less than 10, salicylate level less than 7, alcohol level less than 10, negative Covid PCR, potassium 3.2, renal function okay, temperature normal, blood pressure 122/99, heart  rate 127, 98, oxygen saturation 86% on room air, improved to 200% on 2 L oxygen.  Chest x-ray is negative for infiltration.  Patient is placed on progressive bed observation. Dr. Joseph Art of psych is consulted   Review of Systems: Could not reviewed accurately due to altered mental status  Allergy: No Known Allergies  Past Medical History:  Diagnosis Date  . Diabetes mellitus without complication (HCC)   . Hepatitis C   . Thyroid disease     Past Surgical History:  Procedure Laterality Date  . TUBAL LIGATION      Social History:  reports that she has quit smoking. She has never used smokeless tobacco. She reports current drug use. Drugs: Marijuana and Cocaine. She reports that she does not drink alcohol.  Family History:  Family History  Problem Relation Age of Onset  . Diabetes Mellitus II Mother   . Diabetes Mellitus II Father      Prior to Admission medications   Medication Sig Start Date End Date Taking? Authorizing Provider  Buprenorphine HCl-Naloxone HCl 8-2 MG FILM Place under the tongue 3 (three) times daily. 06/05/20  Yes [provider]  cetirizine (ZYRTEC) 10 MG tablet Take 10 mg by mouth daily. 02/26/19  Yes [provider]  gabapentin (NEURONTIN) 800 MG tablet Take 800 mg by mouth 4 (four) times daily. 04/05/19  Yes [provider]  JARDIANCE 25 MG TABS tablet Take 25 mg by mouth daily. 02/12/19  Yes [provider]  levothyroxine (SYNTHROID) 100 MCG tablet Take 100 mcg by mouth daily. 05/08/20  Yes  [provider]  lisinopril (ZESTRIL) 2.5 MG tablet Take 2.5 mg by mouth daily. 05/08/20  Yes [provider]  metFORMIN (GLUCOPHAGE) 1000 MG tablet Take 1,000 mg by mouth 2 (two) times daily. 02/27/19  Yes [provider]  mirtazapine (REMERON SOL-TAB) 15 MG disintegrating tablet Take 15 mg by mouth at bedtime.   Yes [provider]  naloxone (NARCAN) 0.4 MG/ML injection Inject into the muscle. 02/13/20  Yes  [provider]  omeprazole (PRILOSEC) 20 MG capsule Take 20 mg by mouth daily. 02/27/19  Yes [provider]  venlafaxine XR (EFFEXOR-XR) 75 MG 24 hr capsule Take 225 mg by mouth daily. 02/26/19  Yes [provider]  VICTOZA 18 MG/3ML SOPN Inject 1.2 mg into the skin daily. 05/08/20  Yes [provider]  mirtazapine (REMERON SOL-TAB) 30 MG disintegrating tablet Take 30 mg by mouth at bedtime. Patient not taking: Reported on 09/05/2020 05/08/20   [provider]    Physical Exam: Vitals:   09/05/20 1345 09/05/20 1400 09/05/20 1415 09/05/20 1441  BP: 138/90 (!) 176/117 (!) 144/111 112/90  Pulse: 91 (!) 109 91 100  Resp: (!) 22 20 20 18   Temp:      TempSrc:      SpO2: 99%     Weight:      Height:       General: Not in acute distress HEENT:       Eyes: PERRL, EOMI, no scleral icterus.       ENT: No discharge from the ears and nose      Neck: No JVD, no bruit, no mass felt. Heme: No neck lymph node enlargement. Cardiac: S1/S2, RRR, No murmurs, No gallops or rubs. Respiratory:  No rales, wheezing, rhonchi or rubs. GI: Soft, nondistended, nontender, no organomegaly, BS present. GU: No hematuria Ext: No pitting leg edema bilaterally. 1+DP/PT pulse bilaterally. Musculoskeletal: No joint deformities, No joint redness or warmth, no limitation of ROM in spin. Skin: No rashes.  Neuro: Confused, orientated to the place, knows her own name, not orientated to the time, Cranial nerves II-XII grossly intact, moves all extremities  Psych: Patient has suicidal attempt . Psych: Patient is not psychotic, no suicidal or hemocidal ideation.  Labs on Admission: I have personally reviewed following labs and imaging studies  CBC: Recent Labs  Lab 09/05/20 0257  WBC 10.7*  HGB 10.0*  HCT 32.2*  MCV 85.9  PLT 206   Basic Metabolic Panel: Recent Labs  Lab 09/05/20 0257  NA 134*  K 3.2*  CL 98  CO2 24  GLUCOSE 137*  BUN 13  CREATININE 0.79  CALCIUM  9.8  MG 2.0   GFR: Estimated Creatinine Clearance: 106.1 mL/min (by C-G formula based on SCr of 0.79 mg/dL). Liver Function Tests: Recent Labs  Lab 09/05/20 0257  AST 23  ALT 14  ALKPHOS 53  BILITOT 0.6  PROT 8.8*  ALBUMIN 4.6   No results for input(s): LIPASE, AMYLASE in the last 168 hours. No results for input(s): AMMONIA in the last 168 hours. Coagulation Profile: No results for input(s): INR, PROTIME in the last 168 hours. Cardiac Enzymes: Recent Labs  Lab 09/05/20 0257  CKTOTAL 189   BNP (last 3 results) No results for input(s): PROBNP in the last 8760 hours. HbA1C: No results for input(s): HGBA1C in the last 72 hours. CBG: No results for input(s): GLUCAP in the last 168 hours. Lipid Profile: No results for input(s): CHOL, HDL, LDLCALC, TRIG, CHOLHDL, LDLDIRECT in the last  72 hours. Thyroid Function Tests: No results for input(s): TSH, T4TOTAL, FREET4, T3FREE, THYROIDAB in the last 72 hours. Anemia Panel: No results for input(s): VITAMINB12, FOLATE, FERRITIN, TIBC, IRON, RETICCTPCT in the last 72 hours. Urine analysis:    Component Value Date/Time   COLORURINE YELLOW (A) 06/13/2020 0357   APPEARANCEUR HAZY (A) 06/13/2020 0357   LABSPEC 1.022 06/13/2020 0357   PHURINE 5.0 06/13/2020 0357   GLUCOSEU >=500 (A) 06/13/2020 0357   HGBUR NEGATIVE 06/13/2020 0357   BILIRUBINUR SMALL (A) 06/13/2020 0357   KETONESUR NEGATIVE 06/13/2020 0357   PROTEINUR 30 (A) 06/13/2020 0357   NITRITE POSITIVE (A) 06/13/2020 0357   LEUKOCYTESUR NEGATIVE 06/13/2020 0357   Sepsis Labs: @LABRCNTIP (procalcitonin:4,lacticidven:4) ) Recent Results (from the past 240 hour(s))  Respiratory Panel by RT PCR (Flu A&B, Covid) - Nasopharyngeal Swab     Status: None   Collection Time: 09/05/20  3:07 AM   Specimen: Nasopharyngeal Swab  Result Value Ref Range Status   SARS Coronavirus 2 by RT PCR NEGATIVE NEGATIVE Final    Comment: (NOTE) SARS-CoV-2 target nucleic acids are NOT  DETECTED.  The SARS-CoV-2 RNA is generally detectable in upper respiratoy specimens during the acute phase of infection. The lowest concentration of SARS-CoV-2 viral copies this assay can detect is 131 copies/mL. A negative result does not preclude SARS-Cov-2 infection and should not be used as the sole basis for treatment or other patient management decisions. A negative result may occur with  improper specimen collection/handling, submission of specimen other than nasopharyngeal swab, presence of viral mutation(s) within the areas targeted by this assay, and inadequate number of viral copies (<131 copies/mL). A negative result must be combined with clinical observations, patient history, and epidemiological information. The expected result is Negative.  Fact Sheet for Patients:  https://www.moore.com/https://www.fda.gov/media/142436/download  Fact Sheet for Healthcare Providers:  https://www.young.biz/https://www.fda.gov/media/142435/download  This test is no t yet approved or cleared by the [place] FDA and  has been authorized for detection and/or diagnosis of SARS-CoV-2 by FDA under an Emergency Use Authorization (EUA). This EUA will remain  in effect (meaning this test can be used) for the duration of the COVID-19 declaration under Section 564(b)(1) of the Act, 21 U.S.C. section 360bbb-3(b)(1), unless the authorization is terminated or revoked sooner.     Influenza A by PCR NEGATIVE NEGATIVE Final   Influenza B by PCR NEGATIVE NEGATIVE Final    Comment: (NOTE) The Xpert Xpress SARS-CoV-2/FLU/RSV assay is intended as an aid in  the diagnosis of influenza from Nasopharyngeal swab specimens and  should not be used as a sole basis for treatment. Nasal washings and  aspirates are unacceptable for Xpert Xpress SARS-CoV-2/FLU/RSV  testing.  Fact Sheet for Patients: https://www.moore.com/https://www.fda.gov/media/142436/download  Fact Sheet for Healthcare Providers: https://www.young.biz/https://www.fda.gov/media/142435/download  This test is not yet  approved or cleared by the [place] FDA and  has been authorized for detection and/or diagnosis of SARS-CoV-2 by  FDA under an Emergency Use Authorization (EUA). This EUA will remain  in effect (meaning this test can be used) for the duration of the  Covid-19 declaration under Section 564(b)(1) of the Act, 21  U.S.C. section 360bbb-3(b)(1), unless the authorization is  terminated or revoked. Performed at [facility], [address] [place], [place] [address]      Radiological Exams on Admission: DG Chest Portable 1 View  Result Date: 09/05/2020 CLINICAL DATA:  Initial evaluation for acute hypoxia, overdose. EXAM: PORTABLE CHEST 1 VIEW COMPARISON:  Prior radiograph from 06/13/2020. FINDINGS: Cardiac and mediastinal silhouettes  are stable, and remain within normal limits. Lungs are hypoinflated. Secondary diffuse bronchovascular crowding with mild bibasilar atelectasis. No other active cardiopulmonary disease. No definite focal infiltrates. No edema or effusion. No pneumothorax. No acute osseous finding. IMPRESSION: 1. Shallow lung inflation with secondary diffuse bronchovascular crowding and mild bibasilar atelectasis. 2. No other active cardiopulmonary disease. Electronically Signed   By: Rise Mu M.D.   On: 09/05/2020 06:10     EKG: I have personally reviewed.  Sinus rhythm, QTC 496, tachycardia with heart rate of 106, nonspecific T wave change  Assessment/Plan Principal Problem:   Overdose Active Problems:   Hypothyroidism   Cannabis use disorder, moderate, dependence (HCC)   Polysubstance abuse (HCC)   Diabetes mellitus without complication (HCC)   Cocaine abuse (HCC)   HTN (hypertension)   Depression   GERD (gastroesophageal reflux disease)   Hypokalemia   Overdose: Possibly overdosed Benadryl.  CK 189, alcohol level less than 10, Tylenol level less than 10, salicylate level less than 7.  QTC 496.  Her mental status has improved, but still  intermittently becomes very confused, not redirectable and constantly trying to leave the bed. Dr. Joseph Art of psych is consulted. Per her mother, pt has suicidal attempt  -Placed on stepdown for observation -Frequent neuro check -Sitter at the bedside -As needed Ativan for agitation  Hypothyroidism -Synthroid  Polysubstance abuse, cannabis use disorder, moderate, dependence and cocaine abuse: -need counseling when patient mental status improves  Diabetes mellitus without complication (HCC): Recent A1c 11.8, poorly controlled with patient is taking Metformin, Victoza and Jardiance at home -SSI  HTN (hypertension) -IV hydralazine as needed -Lisinopril  Opioid use disorder: -Hold Suboxone now  Depression with suicidal attempt: -We will hold home medications, including Remeron, Effexor, since we do not know if patient also overdosed of those medications -Follow-up psychiatrist recommendation  GERD (gastroesophageal reflux disease) -Protonix  Hypokalemia: K= 3.2 on admission. - Repleted K - Check Mg level -->2.0      Status is: Observation  The patient remains OBS appropriate and will d/c before 2 midnights.  Dispo: The patient is from: Home              Anticipated d/c is to: to be determined.              Anticipated d/c date is: 1 day              Patient currently is not medically stable to d/c.            DVT ppx:   SQ Lovenox Code Status: Full code Family Communication:   Yes, patient's mother by phone Disposition Plan: to be determined Consults called:  Message sent to Dr. Joseph Art of psych for consult Admission status:  progressive unit for obs         Date of Service 09/05/2020    Lorretta Harp [facility]   If 7PM-7AM, please contact night-coverage [email or web address] 09/05/2020, 2:57 PM

## 2020-09-05 NOTE — BH Assessment (Signed)
This writer attempted to assess patient, patient currently unable to participate in assessment at this time.

## 2020-09-05 NOTE — Progress Notes (Addendum)
Refusing all medications will not speak to me.

## 2020-09-05 NOTE — ED Notes (Signed)
Pt placed on 2L O2 d/t O2 sat decreasing to 86% on RA. Pt's O2 sat increased to 96% on 2L. EDP informed.

## 2020-09-05 NOTE — ED Notes (Signed)
Pt is currently trying to get out of bed at this time, pt is redirectable but does go back to trying to get out of bed when no one is watching her. Bed alarm in place, sitter requested, RN currently sitting with pt. IVF infusing at this time. Pt educated for her safety that she needs to stay in bed at this time that she is still unstable at this time, pt states "no I dont", Pt then stated she needed to urinate, pt placed on bed pan due to unsteadiness on feet and potential fall risk at this time.

## 2020-09-05 NOTE — Progress Notes (Addendum)
Patient started cursing at family on phone stating that we were not doing anything for her and that she wanted to go home. Stating that she hasn't been given anything all day. I spoke with charge nurse and was informed that she should not have the phone since she is on a psych hold. Removed phone from patient, she then cursed again and knocked her food off her table. She has now pulled all monitor wires off and refuses to put them back on.

## 2020-09-05 NOTE — ED Notes (Signed)
Pt transferred to ICU with multiple staff at bedside and security as well. Pt was uncooperative en route to ICU and bedside report given as well as telephone prior to transfer. VSS. Restraints still in place due to aggression and violence toward staff.

## 2020-09-05 NOTE — ED Notes (Signed)
Pt is still being non complaint with staying in the bed. Pt has used the bed pan multiple times but has no started urinating in the bed. Pt is a violent risk to others as she tried kicking this RN and another RN when trying to redirect pt to get back in bed. Sitter at bedside and bed alarm in place, and yellow socks also in place.

## 2020-09-06 ENCOUNTER — Encounter: Payer: Self-pay | Admitting: Internal Medicine

## 2020-09-06 DIAGNOSIS — F515 Nightmare disorder: Secondary | ICD-10-CM | POA: Diagnosis present

## 2020-09-06 DIAGNOSIS — E039 Hypothyroidism, unspecified: Secondary | ICD-10-CM | POA: Diagnosis present

## 2020-09-06 DIAGNOSIS — E876 Hypokalemia: Secondary | ICD-10-CM | POA: Diagnosis present

## 2020-09-06 DIAGNOSIS — F1220 Cannabis dependence, uncomplicated: Secondary | ICD-10-CM | POA: Diagnosis not present

## 2020-09-06 DIAGNOSIS — I10 Essential (primary) hypertension: Secondary | ICD-10-CM | POA: Diagnosis present

## 2020-09-06 DIAGNOSIS — F332 Major depressive disorder, recurrent severe without psychotic features: Secondary | ICD-10-CM

## 2020-09-06 DIAGNOSIS — F3130 Bipolar disorder, current episode depressed, mild or moderate severity, unspecified: Secondary | ICD-10-CM | POA: Diagnosis present

## 2020-09-06 DIAGNOSIS — Z87891 Personal history of nicotine dependence: Secondary | ICD-10-CM | POA: Diagnosis not present

## 2020-09-06 DIAGNOSIS — E119 Type 2 diabetes mellitus without complications: Secondary | ICD-10-CM | POA: Diagnosis not present

## 2020-09-06 DIAGNOSIS — T50904A Poisoning by unspecified drugs, medicaments and biological substances, undetermined, initial encounter: Secondary | ICD-10-CM | POA: Diagnosis not present

## 2020-09-06 DIAGNOSIS — G8929 Other chronic pain: Secondary | ICD-10-CM | POA: Diagnosis present

## 2020-09-06 DIAGNOSIS — F1121 Opioid dependence, in remission: Secondary | ICD-10-CM | POA: Diagnosis present

## 2020-09-06 DIAGNOSIS — T450X2A Poisoning by antiallergic and antiemetic drugs, intentional self-harm, initial encounter: Secondary | ICD-10-CM | POA: Diagnosis not present

## 2020-09-06 DIAGNOSIS — Z79890 Hormone replacement therapy (postmenopausal): Secondary | ICD-10-CM | POA: Diagnosis not present

## 2020-09-06 DIAGNOSIS — B1920 Unspecified viral hepatitis C without hepatic coma: Secondary | ICD-10-CM | POA: Diagnosis present

## 2020-09-06 DIAGNOSIS — F1910 Other psychoactive substance abuse, uncomplicated: Secondary | ICD-10-CM

## 2020-09-06 DIAGNOSIS — R443 Hallucinations, unspecified: Secondary | ICD-10-CM | POA: Diagnosis present

## 2020-09-06 DIAGNOSIS — F09 Unspecified mental disorder due to known physiological condition: Secondary | ICD-10-CM | POA: Diagnosis present

## 2020-09-06 DIAGNOSIS — T50902A Poisoning by unspecified drugs, medicaments and biological substances, intentional self-harm, initial encounter: Secondary | ICD-10-CM

## 2020-09-06 DIAGNOSIS — Z833 Family history of diabetes mellitus: Secondary | ICD-10-CM | POA: Diagnosis not present

## 2020-09-06 DIAGNOSIS — Z20822 Contact with and (suspected) exposure to covid-19: Secondary | ICD-10-CM | POA: Diagnosis present

## 2020-09-06 DIAGNOSIS — E871 Hypo-osmolality and hyponatremia: Secondary | ICD-10-CM | POA: Diagnosis not present

## 2020-09-06 DIAGNOSIS — Z781 Physical restraint status: Secondary | ICD-10-CM | POA: Diagnosis not present

## 2020-09-06 DIAGNOSIS — Z79899 Other long term (current) drug therapy: Secondary | ICD-10-CM | POA: Diagnosis not present

## 2020-09-06 DIAGNOSIS — Z7984 Long term (current) use of oral hypoglycemic drugs: Secondary | ICD-10-CM | POA: Diagnosis not present

## 2020-09-06 DIAGNOSIS — F1410 Cocaine abuse, uncomplicated: Secondary | ICD-10-CM | POA: Diagnosis present

## 2020-09-06 DIAGNOSIS — K219 Gastro-esophageal reflux disease without esophagitis: Secondary | ICD-10-CM | POA: Diagnosis present

## 2020-09-06 DIAGNOSIS — R451 Restlessness and agitation: Secondary | ICD-10-CM | POA: Diagnosis present

## 2020-09-06 DIAGNOSIS — F419 Anxiety disorder, unspecified: Secondary | ICD-10-CM | POA: Diagnosis present

## 2020-09-06 LAB — CBC
HCT: 32.8 % — ABNORMAL LOW (ref 36.0–46.0)
Hemoglobin: 10.4 g/dL — ABNORMAL LOW (ref 12.0–15.0)
MCH: 27.4 pg (ref 26.0–34.0)
MCHC: 31.7 g/dL (ref 30.0–36.0)
MCV: 86.5 fL (ref 80.0–100.0)
Platelets: 209 10*3/uL (ref 150–400)
RBC: 3.79 MIL/uL — ABNORMAL LOW (ref 3.87–5.11)
RDW: 17.7 % — ABNORMAL HIGH (ref 11.5–15.5)
WBC: 6.8 10*3/uL (ref 4.0–10.5)
nRBC: 0 % (ref 0.0–0.2)

## 2020-09-06 LAB — BASIC METABOLIC PANEL
Anion gap: 8 (ref 5–15)
BUN: 19 mg/dL (ref 6–20)
CO2: 25 mmol/L (ref 22–32)
Calcium: 9.5 mg/dL (ref 8.9–10.3)
Chloride: 101 mmol/L (ref 98–111)
Creatinine, Ser: 0.75 mg/dL (ref 0.44–1.00)
GFR, Estimated: >60 mL/min (ref >60)
Glucose, Bld: 333 mg/dL — ABNORMAL HIGH (ref 70–99)
Potassium: 4.3 mmol/L (ref 3.5–5.1)
Sodium: 134 mmol/L — ABNORMAL LOW (ref 135–145)

## 2020-09-06 LAB — GLUCOSE, CAPILLARY
Glucose-Capillary: 246 mg/dL — ABNORMAL HIGH (ref 70–99)
Glucose-Capillary: 287 mg/dL — ABNORMAL HIGH (ref 70–99)
Glucose-Capillary: 193 mg/dL — ABNORMAL HIGH (ref 70–99)

## 2020-09-06 LAB — MAGNESIUM: Magnesium: 2 mg/dL (ref 1.7–2.4)

## 2020-09-06 MED ORDER — TROLAMINE SALICYLATE 10 % EX CREA
TOPICAL_CREAM | CUTANEOUS | Status: DC | PRN
  Filled 2020-09-06: qty 85

## 2020-09-06 MED ORDER — INSULIN GLARGINE 100 UNIT/ML ~~LOC~~ SOLN
20.0000 [IU] | Freq: Every day | SUBCUTANEOUS | Status: DC
  Administered 2020-09-06 – 2020-09-07 (×2): 20 [IU] via SUBCUTANEOUS
  Filled 2020-09-06 (×3): qty 0.2

## 2020-09-06 MED ORDER — BUPRENORPHINE HCL-NALOXONE HCL 8-2 MG SL SUBL
1.0000 | SUBLINGUAL_TABLET | Freq: Three times a day (TID) | SUBLINGUAL | Status: DC
  Administered 2020-09-06 – 2020-09-07 (×6): 1 via SUBLINGUAL
  Filled 2020-09-06 (×6): qty 1

## 2020-09-06 MED ORDER — GABAPENTIN 400 MG PO CAPS
800.0000 mg | ORAL_CAPSULE | Freq: Four times a day (QID) | ORAL | Status: DC
  Administered 2020-09-06 – 2020-09-07 (×8): 800 mg via ORAL
  Filled 2020-09-06: qty 8
  Filled 2020-09-06 (×4): qty 2
  Filled 2020-09-06: qty 8
  Filled 2020-09-06 (×7): qty 2

## 2020-09-06 NOTE — Progress Notes (Addendum)
PROGRESS NOTE    Stephanie Mohairara Schmutz   WUJ:[id number]  DOB: January 15, 1981  [facility]    DOA: 09/05/2020 LOS: 0   Brief Narrative   Stephanie Barry is a 39 y.o. female with medical history significant of hypertension, diabetes mellitus, GERD, hypothyroidism, depression, HCV, polysubstance abuse (opiates, cannabis, cocaine, benzo, cocaine), suicidal, who presented to the ED on 09/05/20 with altered mental status due to Benadryl overdose.  Patient's mother reported to admitting physician that this was a suicide attempt, estimated pt took over 100 tablets of Benadryl.    Patient IVC'd in the ED. Psychiatry consulted. Poison control recommended supportive care, monitoring of Qtc.  Admitted to ICU/stepdown with 1:1 sitter.  Required restraints initially and had severe agitation with many reports of patient verbally assaulting staff and refusing care.  11/7 - pt off restraints, calm, but irritable about being kept in hospital.  Seen by psychiatry today.  Plan is to discharge to behavioral health unit pending medical clearance.  Due to hyperglycemia and still elevated Qtc, will continue acute hospital stay for today.      Assessment & Plan   Principal Problem:   Overdose Active Problems:   Hypothyroidism   Cannabis use disorder, moderate, dependence (HCC)   Polysubstance abuse (HCC)   Diabetes mellitus without complication (HCC)   Cocaine abuse (HCC)   HTN (hypertension)   Depression   GERD (gastroesophageal reflux disease)   Hypokalemia   Intentional overdose of drug in tablet form (HCC)   Overdose - Took unknown quantity of Benadryl (over 100 per mother).  Pt denies suicide attempt but is quite withdrawn in discussion.  Family report this was suicide attempt.   On admission, CK 189, alcohol level <10, Tylenol <10, salicylate <7.   QTc prolonged at 496.   --Dr. Joseph ArtSubedi, psychiatry, consulted --transfer to med/surg --monitor QTc, vitals etc. --continue  1:1 sitter --neuro checks --[facility], expect tomorrow  Depression with suicidal attempt - Remeron and Effexor initially held - resumed per psychiatry today.  Psych following.  Hypothyroidism - Synthroid  Polysubstance abuse, cannabis use disorder, moderate, dependence and cocaine abuse - Counseled regarding importance of cessation.  Psych following.  Diabetes mellitus with peripheral neuropathy- Recent A1c 11.8, poorly controlled.  Hold home Metformin, Victoza and Jardiance. Continue home Lantus.  Sliding scale Novolog.  Resumed gabapentin. Changed diet to regular on patient's insistence.  Hypertension - IV hydralazine as needed.  Continue Lisinopril  Opioid use disorder - resumed Suboxone today  GERD - Protonix    DVT prophylaxis: enoxaparin (LOVENOX) injection 40 mg Start: 09/05/20 2200   Diet:  Diet Orders (From admission, onward)    Start     Ordered   09/06/20 0924  Diet regular Room service appropriate? Yes; Fluid consistency: Thin  Diet effective now       Question Answer Comment  Room service appropriate? Yes   Fluid consistency: Thin      09/06/20 0923            Code Status: Full Code    Subjective 09/06/20    Pt seen in stepdown with sitter present.  Out of restraints and calm.  Irritable and states she needs her Suboxone and gabapentin.  Wants a regular diet so she can have bacon.  She denies suicide attempt, says she was taking Benadryl to help sleep but it wasn't working.     Disposition Plan & Communication   Status is: Inpatient  Remains inpatient appropriate  because: patient is under IVC for suicide attempt by overdose, to be admitted to [facility] when medically clear.  Continues to have QTc prolongation and hyperglycemia.   Dispo: The patient is from: Home              Anticipated d/c is to: Behavioral health unit              Anticipated d/c date is: 1 day              Patient currently is not medically stable to  d/c.    Family Communication: none at bedside.  Psychiatry spoke with husband today.  No further medical updates to provide.    Consults, Procedures, Significant Events   Consultants:   Psychiatry  Procedures:   none  Antimicrobials:  Anti-infectives (From admission, onward)   None        Objective   Vitals:   09/06/20 0900 09/06/20 0905 09/06/20 1000 09/06/20 1533  BP: 102/67 102/67 109/83 (!) 145/98  Pulse: 85  83 99  Resp: 16  12   Temp:      TempSrc:      SpO2: 95%  97% 97%  Weight:      Height:       No intake or output data in the 24 hours ending 09/06/20 1544 Filed Weights   09/05/20 0450  Weight: 83.9 kg    Physical Exam:  General exam: awake, alert, no acute distress Respiratory system: CTAB, no wheezes, rales or rhonchi, normal respiratory effort. Cardiovascular system: normal S1/S2, RRR, no JVD, murmurs, rubs, gallops, no pedal edema.   Gastrointestinal system: soft, NT, ND, no HSM felt, +bowel sounds. Central nervous system: A&O x4. no gross focal neurologic deficits, normal speech Extremities: moves all, no edema, normal tone Skin: dry, intact, normal temperature, normal color Psychiatry: irritable mood, congruent affect  Labs   Data Reviewed: I have personally reviewed following labs and imaging studies  CBC: Recent Labs  Lab 09/05/20 0257 09/06/20 0509  WBC 10.7* 6.8  HGB 10.0* 10.4*  HCT 32.2* 32.8*  MCV 85.9 86.5  PLT 206 209   Basic Metabolic Panel: Recent Labs  Lab 09/05/20 0257 09/06/20 0509  NA 134* 134*  K 3.2* 4.3  CL 98 101  CO2 24 25  GLUCOSE 137* 333*  BUN 13 19  CREATININE 0.79 0.75  CALCIUM 9.8 9.5  MG 2.0 2.0   GFR: Estimated Creatinine Clearance: 106.1 mL/min (by C-G formula based on SCr of 0.75 mg/dL). Liver Function Tests: Recent Labs  Lab 09/05/20 0257  AST 23  ALT 14  ALKPHOS 53  BILITOT 0.6  PROT 8.8*  ALBUMIN 4.6   No results for input(s): LIPASE, AMYLASE in the last 168 hours. No  results for input(s): AMMONIA in the last 168 hours. Coagulation Profile: No results for input(s): INR, PROTIME in the last 168 hours. Cardiac Enzymes: Recent Labs  Lab 09/05/20 0257  CKTOTAL 189   BNP (last 3 results) No results for input(s): PROBNP in the last 8760 hours. HbA1C: No results for input(s): HGBA1C in the last 72 hours. CBG: Recent Labs  Lab 09/05/20 1552 09/05/20 1801 09/06/20 0830 09/06/20 1207 09/06/20 1533  GLUCAP 129* 74 246* 287* 193*   Lipid Profile: No results for input(s): CHOL, HDL, LDLCALC, TRIG, CHOLHDL, LDLDIRECT in the last 72 hours. Thyroid Function Tests: No results for input(s): TSH, T4TOTAL, FREET4, T3FREE, THYROIDAB in the last 72 hours. Anemia Panel: No results for input(s): VITAMINB12, FOLATE, FERRITIN, TIBC, IRON,  RETICCTPCT in the last 72 hours. Sepsis Labs: No results for input(s): PROCALCITON, LATICACIDVEN in the last 168 hours.  Recent Results (from the past 240 hour(s))  Respiratory Panel by RT PCR (Flu A&B, Covid) - Nasopharyngeal Swab     Status: None   Collection Time: 09/05/20  3:07 AM   Specimen: Nasopharyngeal Swab  Result Value Ref Range Status   SARS Coronavirus 2 by RT PCR NEGATIVE NEGATIVE Final    Comment: (NOTE) SARS-CoV-2 target nucleic acids are NOT DETECTED.  The SARS-CoV-2 RNA is generally detectable in upper respiratoy specimens during the acute phase of infection. The lowest concentration of SARS-CoV-2 viral copies this assay can detect is 131 copies/mL. A negative result does not preclude SARS-Cov-2 infection and should not be used as the sole basis for treatment or other patient management decisions. A negative result may occur with  improper specimen collection/handling, submission of specimen other than nasopharyngeal swab, presence of viral mutation(s) within the areas targeted by this assay, and inadequate number of viral copies (<131 copies/mL). A negative result must be combined with  clinical observations, patient history, and epidemiological information. The expected result is Negative.  Fact Sheet for Patients:  https://www.moore.com/  Fact Sheet for Healthcare Providers:  https://www.young.biz/  This test is no t yet approved or cleared by the [place] FDA and  has been authorized for detection and/or diagnosis of SARS-CoV-2 by FDA under an Emergency Use Authorization (EUA). This EUA will remain  in effect (meaning this test can be used) for the duration of the COVID-19 declaration under Section 564(b)(1) of the Act, 21 U.S.C. section 360bbb-3(b)(1), unless the authorization is terminated or revoked sooner.     Influenza A by PCR NEGATIVE NEGATIVE Final   Influenza B by PCR NEGATIVE NEGATIVE Final    Comment: (NOTE) The Xpert Xpress SARS-CoV-2/FLU/RSV assay is intended as an aid in  the diagnosis of influenza from Nasopharyngeal swab specimens and  should not be used as a sole basis for treatment. Nasal washings and  aspirates are unacceptable for Xpert Xpress SARS-CoV-2/FLU/RSV  testing.  Fact Sheet for Patients: https://www.moore.com/  Fact Sheet for Healthcare Providers: https://www.young.biz/  This test is not yet approved or cleared by the [place] FDA and  has been authorized for detection and/or diagnosis of SARS-CoV-2 by  FDA under an Emergency Use Authorization (EUA). This EUA will remain  in effect (meaning this test can be used) for the duration of the  Covid-19 declaration under Section 564(b)(1) of the Act, 21  U.S.C. section 360bbb-3(b)(1), unless the authorization is  terminated or revoked. Performed at [facility], [address], [place], [place] [address]       Imaging Studies   DG Chest Portable 1 View  Result Date: 09/05/2020 CLINICAL DATA:  Initial evaluation for acute hypoxia, overdose. EXAM: PORTABLE CHEST 1 VIEW COMPARISON:   Prior radiograph from 06/13/2020. FINDINGS: Cardiac and mediastinal silhouettes are stable, and remain within normal limits. Lungs are hypoinflated. Secondary diffuse bronchovascular crowding with mild bibasilar atelectasis. No other active cardiopulmonary disease. No definite focal infiltrates. No edema or effusion. No pneumothorax. No acute osseous finding. IMPRESSION: 1. Shallow lung inflation with secondary diffuse bronchovascular crowding and mild bibasilar atelectasis. 2. No other active cardiopulmonary disease. Electronically Signed   By: Rise Mu M.D.   On: 09/05/2020 06:10     Medications   Scheduled Meds: . buprenorphine-naloxone  1 tablet Sublingual TID  . Chlorhexidine Gluconate Cloth  6 each Topical Daily  . enoxaparin (LOVENOX)  injection  40 mg Subcutaneous Q24H  . gabapentin  800 mg Oral QID  . insulin aspart  0-9 Units Subcutaneous TID WC  . insulin glargine  20 Units Subcutaneous Daily  . levothyroxine  100 mcg Oral Daily  . lisinopril  2.5 mg Oral Daily  . pantoprazole  40 mg Oral Daily   Continuous Infusions:     LOS: 0 days    Time spent: 30 minutes    Pennie Banter, DO [facility]  09/06/2020, 3:44 PM    If 7PM-7AM, please contact night-coverage. How to contact the [facility] Attending or Consulting provider 7A - 7P or covering provider during after hours 7P -7A, for this patient?    1. Check the care team in [facility] and look for a) attending/consulting [facility] provider listed and b) the [facility] team listed 2. Log into [email or web address] and use [facility]'s universal password to access. If you do not have the password, please contact the hospital operator. 3. Locate the [facility] provider you are looking for under [facility] and page to a number that you can be directly reached. 4. If you still have difficulty reaching the provider, please page the [facility] (Director on Call) for the Hospitalists listed on amion for assistance.

## 2020-09-06 NOTE — Consult Note (Signed)
[facility] Face-to-Face Psychiatry Consult   Reason for Consult:  OD on pills Referring Physician: Dr  Lorretta Harp Patient Identification: Stephanie Barry MRN:  [id number] Principal Diagnosis: Overdose Diagnosis:  Principal Problem:   Overdose Active Problems:   Hypothyroidism   Cannabis use disorder, moderate, dependence (HCC)   Polysubstance abuse (HCC)   Diabetes mellitus without complication (HCC)   Cocaine abuse (HCC)   HTN (hypertension)   Depression   GERD (gastroesophageal reflux disease)   Hypokalemia   Total Time spent with patient: 1 hour  Subjective:   I was trying to sleep, bendryl was not helping and I took more and more"  HPI- Stephanie Barry is a 39 y.o. female patient admitted with AMS s/ OD on benadryl.pt has  medical history significant of hypertension, diabetes mellitus, GERD, hypothyroidism, depression, HCV, polysubstance abuse (opiates, cannabis, ),  who presents with overdose and altered mental status, suspected intentional OD . Per Dr. Clyde Lundborg note, Per her mother (I called her mother by phone), pt likely took over 100 tablets of benadryl for suicidal attempt.Pt has altered mental status, confused, extremely somnolent at arrival to ED. Pt was agitated in Ed needing restraints and initially not cooperative in ICU.  UDS (benzo, cocaine, cannabinoid, TCA), negative pregnancy test, Tylenol level less than 10, salicylate level less than 7, alcohol level less than 10, negative Covid PCR.  Pt lying in bed, sitter present . Pt reporters long hx of depression, mood instability, sleep problem. Pt guarded, anxious, slightly irritable, denies that it was suicidal attempt, admits taking  few at a time for sleep.  Reports anxiety, flashback of past trauma, poor concentration, chr pain issues. Admits using cannabis " whenever I get it" for pain, unable to give details. Pt reports hx of pain pill ( mainly oxycodone) abuse hx which she initially got for back pain, and pt currently on suboxone  maintenance. Denies use of other substances. No evidence of psychosis. Pt reports nightmares at times. Pt lives with her husband and 5 kids.  Called her husband who reports that pt was doing very good on her medication, and did not voice any suicidal statements recently. He reports pt sometimes misses taking effexor and other meds, but takes most of the time. Husband  admits she took at least 50-60 pills but minimizing the suicide attempt.    Past Psychiatric History: see above.  H/o depression, mainly opiod use disorder on suboxone maintenance, cannabis abuse  Hospitalizations 2-3 times in the past, last about 8 months ago, has hx of rehab for susbtance use. Hx of OD on pill about 4 yrs ago.  OP provider- [facility], Painter, Whitney Muse, NP Risk to Self:  yes Risk to Others:   Prior Inpatient Therapy:  yes Prior Outpatient Therapy:  yes  Past Medical History:  Past Medical History:  Diagnosis Date  . Diabetes mellitus without complication (HCC)   . Hepatitis C   . Thyroid disease     Past Surgical History:  Procedure Laterality Date  . TUBAL LIGATION     Family History:  Family History  Problem Relation Age of Onset  . Diabetes Mellitus II Mother   . Diabetes Mellitus II Father    Family Psychiatric  History: " they all have anxiety" Social History:  Social History   Substance and Sexual Activity  Alcohol Use No     Social History   Substance and Sexual Activity  Drug Use Yes  . Types: Marijuana, Cocaine    Social History  Socioeconomic History  . Marital status: Married    Spouse name: Peyton NajjarLarry   . Number of children: 5  . Years of education: Not on file  . Highest education level: Not on file  Occupational History  . Not on file  Tobacco Use  . Smoking status: Former Games developermoker  . Smokeless tobacco: Never Used  Substance and Sexual Activity  . Alcohol use: No  . Drug use: Yes    Types: Marijuana, Cocaine  . Sexual activity: Not on file  Other  Topics Concern  . Not on file  Social History Narrative  . Not on file   Social Determinants of Health   Financial Resource Strain:   . Difficulty of Paying Living Expenses: Not on file  Food Insecurity:   . Worried About Programme researcher, broadcasting/film/videounning Out of Food in the Last Year: Not on file  . Ran Out of Food in the Last Year: Not on file  Transportation Needs:   . Lack of Transportation (Medical): Not on file  . Lack of Transportation (Non-Medical): Not on file  Physical Activity:   . Days of Exercise per Week: Not on file  . Minutes of Exercise per Session: Not on file  Stress:   . Feeling of Stress : Not on file  Social Connections:   . Frequency of Communication with Friends and Family: Not on file  . Frequency of Social Gatherings with Friends and Family: Not on file  . Attends Religious Services: Not on file  . Active Member of Clubs or Organizations: Not on file  . Attends BankerClub or Organization Meetings: Not on file  . Marital Status: Not on file   Additional Social History:    Allergies:  No Known Allergies  Labs:  Results for orders placed or performed during the hospital encounter of 09/05/20 (from the past 48 hour(s))  CBC     Status: Abnormal   Collection Time: 09/05/20  2:57 AM  Result Value Ref Range   WBC 10.7 (H) 4.0 - 10.5 K/uL   RBC 3.75 (L) 3.87 - 5.11 MIL/uL   Hemoglobin 10.0 (L) 12.0 - 15.0 g/dL   HCT 16.132.2 (L) 36 - 46 %   MCV 85.9 80.0 - 100.0 fL   MCH 26.7 26.0 - 34.0 pg   MCHC 31.1 30.0 - 36.0 g/dL   RDW 09.617.0 (H) 04.511.5 - 40.915.5 %   Platelets 206 150 - 400 K/uL   nRBC 0.0 0.0 - 0.2 %    Comment: Performed at [facility], [address] [place], [place] [id number]  Comprehensive metabolic panel     Status: Abnormal   Collection Time: 09/05/20  2:57 AM  Result Value Ref Range   Sodium 134 (L) 135 - 145 mmol/L   Potassium 3.2 (L) 3.5 - 5.1 mmol/L   Chloride 98 98 - 111 mmol/L   CO2 24 22 - 32 mmol/L   Glucose, Bld 137 (H) 70 - 99 mg/dL    Comment:  Glucose reference range applies only to samples taken after fasting for at least 8 hours.   BUN 13 6 - 20 mg/dL   Creatinine, Ser 4.780.79 0.44 - 1.00 mg/dL   Calcium 9.8 8.9 - 29.510.3 mg/dL   Total Protein 8.8 (H) 6.5 - 8.1 g/dL   Albumin 4.6 3.5 - 5.0 g/dL   AST 23 15 - 41 U/L   ALT 14 0 - 44 U/L   Alkaline Phosphatase 53 38 - 126 U/L   Total Bilirubin 0.6 0.3 -  1.2 mg/dL   GFR, Estimated >49 >17 mL/min    Comment: (NOTE) Calculated using the CKD-EPI Creatinine Equation (2021)    Anion gap 12 5 - 15    Comment: Performed at [facility], [address] [place], [place] [address]  Acetaminophen level     Status: Abnormal   Collection Time: 09/05/20  2:57 AM  Result Value Ref Range   Acetaminophen (Tylenol), Serum <10 (L) 10 - 30 ug/mL    Comment: (NOTE) Therapeutic concentrations vary significantly. A range of 10-30 ug/mL  may be an effective concentration for many patients. However, some  are best treated at concentrations outside of this range. Acetaminophen concentrations >150 ug/mL at 4 hours after ingestion  and >50 ug/mL at 12 hours after ingestion are often associated with  toxic reactions.  Performed at [facility], [address] [place], [place] [address]   Salicylate level     Status: Abnormal   Collection Time: 09/05/20  2:57 AM  Result Value Ref Range   Salicylate Lvl <7.0 (L) 7.0 - 30.0 mg/dL    Comment: Performed at [facility], [address] [place], [place] [address]  Ethanol     Status: None   Collection Time: 09/05/20  2:57 AM  Result Value Ref Range   Alcohol, Ethyl (B) <10 <10 mg/dL    Comment: (NOTE) Lowest detectable limit for serum alcohol is 10 mg/dL.  For medical purposes only. Performed at [facility], [address] [place], [place] [address]   Magnesium     Status: None   Collection Time: 09/05/20  2:57 AM  Result Value Ref Range   Magnesium 2.0 1.7 - 2.4 mg/dL    Comment: Performed at [facility], [address] [place], [place] [address]  CK     Status: None   Collection Time: 09/05/20  2:57 AM  Result Value Ref Range   Total CK 189 38.0 - 234.0 U/L    Comment: Performed at [facility], [address] [place], [place] [address]  Respiratory Panel by RT PCR (Flu A&B, Covid) - Nasopharyngeal Swab     Status: None   Collection Time: 09/05/20  3:07 AM   Specimen: Nasopharyngeal Swab  Result Value Ref Range   SARS Coronavirus 2 by RT PCR NEGATIVE NEGATIVE    Comment: (NOTE) SARS-CoV-2 target nucleic acids are NOT DETECTED.  The SARS-CoV-2 RNA is generally detectable in upper respiratoy specimens during the acute phase of infection. The lowest concentration of SARS-CoV-2 viral copies this assay can detect is 131 copies/mL. A negative result does not preclude SARS-Cov-2 infection and should not be used as the sole basis for treatment or other patient management decisions. A negative result may occur with  improper specimen collection/handling, submission of specimen other than nasopharyngeal swab, presence of viral mutation(s) within the areas targeted by this assay, and inadequate number of viral copies (<131 copies/mL). A negative result must be combined with clinical observations, patient history, and epidemiological information. The expected result is Negative.  Fact Sheet for Patients:  https://www.moore.com/  Fact Sheet for Healthcare Providers:  https://www.young.biz/  This test is no t yet approved or cleared by the [place] FDA and  has been authorized for detection and/or diagnosis of SARS-CoV-2 by FDA under an Emergency Use Authorization (EUA). This EUA will remain  in effect (meaning this test can be used) for the duration of the COVID-19 declaration under Section 564(b)(1) of the Act, 21 U.S.C. section  360bbb-3(b)(1), unless the authorization is terminated or revoked sooner.     Influenza A  by PCR NEGATIVE NEGATIVE   Influenza B by PCR NEGATIVE NEGATIVE    Comment: (NOTE) The Xpert Xpress SARS-CoV-2/FLU/RSV assay is intended as an aid in  the diagnosis of influenza from Nasopharyngeal swab specimens and  should not be used as a sole basis for treatment. Nasal washings and  aspirates are unacceptable for Xpert Xpress SARS-CoV-2/FLU/RSV  testing.  Fact Sheet for Patients: https://www.moore.com/  Fact Sheet for Healthcare Providers: https://www.young.biz/  This test is not yet approved or cleared by the [place] FDA and  has been authorized for detection and/or diagnosis of SARS-CoV-2 by  FDA under an Emergency Use Authorization (EUA). This EUA will remain  in effect (meaning this test can be used) for the duration of the  Covid-19 declaration under Section 564(b)(1) of the Act, 21  U.S.C. section 360bbb-3(b)(1), unless the authorization is  terminated or revoked. Performed at [facility], [address], [place], [place] [address]   Urine Drug Screen, Qualitative [facility] only)     Status: Abnormal   Collection Time: 09/05/20  4:42 AM  Result Value Ref Range   Tricyclic, Ur Screen POSITIVE (A) NONE DETECTED   Amphetamines, Ur Screen NONE DETECTED NONE DETECTED   MDMA (Ecstasy)Ur Screen NONE DETECTED NONE DETECTED   Cocaine Metabolite,Ur [place] POSITIVE (A) NONE DETECTED   Opiate, Ur Screen NONE DETECTED NONE DETECTED   Phencyclidine (PCP) Ur S NONE DETECTED NONE DETECTED   Cannabinoid 50 Ng, Ur [place] POSITIVE (A) NONE DETECTED   Barbiturates, Ur Screen NONE DETECTED NONE DETECTED   Benzodiazepine, Ur Scrn POSITIVE (A) NONE DETECTED   Methadone Scn, Ur NONE DETECTED NONE DETECTED    Comment: (NOTE) Tricyclics + metabolites, urine    Cutoff 1000 ng/mL Amphetamines + metabolites, urine  Cutoff 1000 ng/mL MDMA (Ecstasy), urine              Cutoff 500 ng/mL Cocaine Metabolite, urine          Cutoff 300 ng/mL Opiate +  metabolites, urine        Cutoff 300 ng/mL Phencyclidine (PCP), urine         Cutoff 25 ng/mL Cannabinoid, urine                 Cutoff 50 ng/mL Barbiturates + metabolites, urine  Cutoff 200 ng/mL Benzodiazepine, urine              Cutoff 200 ng/mL Methadone, urine                   Cutoff 300 ng/mL  The urine drug screen provides only a preliminary, unconfirmed analytical test result and should not be used for non-medical purposes. Clinical consideration and professional judgment should be applied to any positive drug screen result due to possible interfering substances. A more specific alternate chemical method must be used in order to obtain a confirmed analytical result. Gas chromatography / mass spectrometry (GC/MS) is the preferred confirm atory method. Performed at [facility], [address] [place], [place] [address]   Pregnancy, urine     Status: None   Collection Time: 09/05/20  4:42 AM  Result Value Ref Range   Preg Test, Ur NEGATIVE NEGATIVE    Comment: Performed at [facility], [address] [place], [place] [address]  Glucose, capillary     Status: Abnormal   Collection Time: 09/05/20  3:52 PM  Result Value  Ref Range   Glucose-Capillary 129 (H) 70 - 99 mg/dL    Comment: Glucose reference range applies only to samples taken after fasting for at least 8 hours.   Comment 1 Notify RN   Glucose, capillary     Status: None   Collection Time: 09/05/20  6:01 PM  Result Value Ref Range   Glucose-Capillary 74 70 - 99 mg/dL    Comment: Glucose reference range applies only to samples taken after fasting for at least 8 hours.  Basic metabolic panel     Status: Abnormal   Collection Time: 09/06/20  5:09 AM  Result Value Ref Range   Sodium 134 (L) 135 - 145 mmol/L   Potassium 4.3 3.5 - 5.1 mmol/L   Chloride 101 98 - 111 mmol/L   CO2 25 22 - 32 mmol/L   Glucose, Bld 333 (H) 70 - 99 mg/dL    Comment: Glucose reference range applies only to samples  taken after fasting for at least 8 hours.   BUN 19 6 - 20 mg/dL   Creatinine, Ser 7.03 0.44 - 1.00 mg/dL   Calcium 9.5 8.9 - 50.0 mg/dL   GFR, Estimated >93 >81 mL/min    Comment: (NOTE) Calculated using the CKD-EPI Creatinine Equation (2021)    Anion gap 8 5 - 15    Comment: Performed at [facility], [address] [place], [place] [address]  CBC     Status: Abnormal   Collection Time: 09/06/20  5:09 AM  Result Value Ref Range   WBC 6.8 4.0 - 10.5 K/uL   RBC 3.79 (L) 3.87 - 5.11 MIL/uL   Hemoglobin 10.4 (L) 12.0 - 15.0 g/dL   HCT 71.6 (L) 36 - 46 %   MCV 86.5 80.0 - 100.0 fL   MCH 27.4 26.0 - 34.0 pg   MCHC 31.7 30.0 - 36.0 g/dL   RDW 96.7 (H) 89.3 - 81.0 %   Platelets 209 150 - 400 K/uL   nRBC 0.0 0.0 - 0.2 %    Comment: Performed at [facility], [address] [place], [place] [address]  Magnesium     Status: None   Collection Time: 09/06/20  5:09 AM  Result Value Ref Range   Magnesium 2.0 1.7 - 2.4 mg/dL    Comment: Performed at [facility], [address] [place], [place] [address]  Glucose, capillary     Status: Abnormal   Collection Time: 09/06/20  8:30 AM  Result Value Ref Range   Glucose-Capillary 246 (H) 70 - 99 mg/dL    Comment: Glucose reference range applies only to samples taken after fasting for at least 8 hours.    Current Facility-Administered Medications  Medication Dose Route Frequency Provider Last Rate Last Admin  . acetaminophen (TYLENOL) tablet 650 mg  650 mg Oral Q6H PRN Lorretta Harp, MD   650 mg at 09/06/20 0136   Or  . acetaminophen (TYLENOL) suppository 650 mg  650 mg Rectal Q6H PRN Lorretta Harp, MD      . albuterol (PROVENTIL) (2.5 MG/3ML) 0.083% nebulizer solution 2.5 mg  2.5 mg Nebulization Q4H PRN Lorretta Harp, MD      . buprenorphine-naloxone (SUBOXONE) 8-2 mg per SL tablet 1 tablet  1 tablet Sublingual TID Esaw Grandchild A, DO   1 tablet at 09/06/20 0957  . Chlorhexidine Gluconate Cloth 2 % PADS 6 each  6 each  Topical Daily Lorretta Harp, MD   6 each at 09/06/20 0906  . enoxaparin (LOVENOX)  injection 40 mg  40 mg Subcutaneous Q24H Lorretta Harp, MD      . gabapentin (NEURONTIN) capsule 800 mg  800 mg Oral QID Esaw Grandchild A, DO   800 mg at 09/06/20 0957  . hydrALAZINE (APRESOLINE) injection 5 mg  5 mg Intravenous Q2H PRN Lorretta Harp, MD      . insulin aspart (novoLOG) injection 0-9 Units  0-9 Units Subcutaneous TID WC Lorretta Harp, MD   3 Units at 09/06/20 0906  . levothyroxine (SYNTHROID) tablet 100 mcg  100 mcg Oral Daily Lorretta Harp, MD   100 mcg at 09/06/20 0512  . lisinopril (ZESTRIL) tablet 2.5 mg  2.5 mg Oral Daily Lorretta Harp, MD   2.5 mg at 09/06/20 0905  . ondansetron (ZOFRAN) injection 4 mg  4 mg Intravenous Q8H PRN Lorretta Harp, MD      . pantoprazole (PROTONIX) EC tablet 40 mg  40 mg Oral Daily Lorretta Harp, MD   40 mg at 09/06/20 0905  . trolamine salicylate (ASPERCREME) 10 % cream   Topical PRN Manuela Schwartz, NP        Musculoskeletal: Strength & Muscle Tone: within normal limits Gait & Station: normal Patient leans:  Psychiatric Specialty Exam: Physical Exam HENT:     Head: Normocephalic.     Mouth/Throat:     Mouth: Mucous membranes are moist.  Pulmonary:     Effort: Pulmonary effort is normal.  Musculoskeletal:     Cervical back: Normal range of motion.  Neurological:     Mental Status: She is alert.     Review of Systems  Blood pressure 109/83, pulse 83, temperature 97.9 F (36.6 C), temperature source Axillary, resp. rate 12, height  (1.702 m), weight 83.9 kg, SpO2 97 %.Body mass index is 28.98 kg/m.  General Appearance: wearing hospital clothes, guarded  Eye Contact:  Fair  Speech:  Normal Rate  Volume:  Normal  Mood:  Anxious, Depressed and Irritable  Affect:  Depressed, anxious  Thought Process:  Goal Directed  Orientation:  Full (Time, Place, and Person)  Thought Content:  Anxiety, past trauma, Denies AVH,   Suicidal Thoughts:  Denies but pt guarded   Homicidal Thoughts:  No  Memory:  Negative  Judgement:  Impaired  Insight:  Lacking  Psychomotor Activity:  Not agitated at this time  Concentration:  fair  Recall:  Good  Fund of Knowledge:  Good  Language:  Good  Akathisia:  Negative  Handed:    AIMS (if indicated):     Assets:  Housing Social Support  ADL's:  Intact  Cognition:  WNL  Sleep:        Treatment Plan Summary: Daily contact with patient to assess and evaluate symptoms and progress in treatment and Medication management Pt with hx of depression, and substance use, s/p serious OD on benadryl, pt denying suicidal attempt but pt is guarded. Per Dr. Evorn Gong note- her mother believes it was suicidal attempt. .   Recommendations- Obtain TSH level Cont 1:1 for safety, cont IVC  Restart Effexor  Plan to keep max disse at   due to concern of not making worse chr hyponatremia  Cont Remeron for depression Cont neurontin for anxiety/chr pain Cont suboxone for opioid maintenance after veryfying  from OP provider Start Minipress  po qhs for nightmares/PTSD Thanks for consult. Disposition: Recommend psychiatric Inpatient admission when medically cleared.  Beverly Sessions, MD 09/06/2020 10:49 AM

## 2020-09-06 NOTE — Hospital Course (Addendum)
Stephanie Barry is a 39 y.o. female with medical history significant of hypertension, diabetes mellitus, GERD, hypothyroidism, depression, HCV, polysubstance abuse (opiates, cannabis, cocaine, benzo, cocaine), suicidal, who presented to the ED on 09/05/20 with altered mental status due to Benadryl overdose.  Patient's mother reported to admitting physician that this was a suicide attempt, estimated pt took over 100 tablets of Benadryl.    Patient IVC'd in the ED. Psychiatry consulted. Poison control recommended supportive care, monitoring of Qtc.  Admitted to ICU/stepdown with 1:1 sitter.  Required restraints initially and had severe agitation with many reports of patient verbally assaulting staff and refusing care.  11/7 - pt off restraints, calm, but irritable about being kept in hospital.  Seen by psychiatry today.  Plan is to discharge to behavioral health unit pending medical clearance.  11/8 - pt medically clear and was re-evaluated by psychiatry, continued to be high risk for recurrent self-harming behaviors and was ultimately discharged to psych unit.

## 2020-09-06 NOTE — Progress Notes (Signed)
Patient arrived to unit at 1520 via bed with sitter, husband, security guard, and nurse. We settled her into the room. After settling and per orders, we cannot let her husband stay for long periods of time therefore we told him he had to leave after visiting for 15 minutes. Patient did become upset but sitter still at bedside. Bed in low position and call bell in reach.

## 2020-09-06 NOTE — Progress Notes (Signed)
Still resting quietly in bed with sitter at bedside, remains calm and cooperative.

## 2020-09-06 NOTE — Progress Notes (Signed)
Resting quietly in bed, has agreed to put monitor leads back on. She has also apologized for her earlier behavior. Provided raisin  Brand and milk. I told her I understood her anger at the situation and I appreciated her apology.

## 2020-09-06 NOTE — Progress Notes (Signed)
Spoke with poison control regarding patient overdose and updated her on patient status. Patient is currently on side of bed with sitter at bedside. She is not complaining of any pain or discomfort. Alert and oriented x4. Bed in low position and call bell in reach.

## 2020-09-06 NOTE — Progress Notes (Signed)
Patient sitting on side of bed, talking to sitter. Has calmed down some.

## 2020-09-07 ENCOUNTER — Encounter: Payer: Self-pay | Admitting: Physician Assistant

## 2020-09-07 ENCOUNTER — Inpatient Hospital Stay
Admission: AD | Admit: 2020-09-07 | Discharge: 2020-09-11 | DRG: 885 | Disposition: A | Discharge status: Home / Self Care | Payer: Medicare Other | Source: Intra-hospital | Attending: Behavioral Health | Admitting: Behavioral Health

## 2020-09-07 ENCOUNTER — Other Ambulatory Visit: Payer: Self-pay

## 2020-09-07 DIAGNOSIS — F514 Sleep terrors [night terrors]: Secondary | ICD-10-CM | POA: Diagnosis present

## 2020-09-07 DIAGNOSIS — F1220 Cannabis dependence, uncomplicated: Secondary | ICD-10-CM | POA: Diagnosis present

## 2020-09-07 DIAGNOSIS — E876 Hypokalemia: Secondary | ICD-10-CM | POA: Diagnosis not present

## 2020-09-07 DIAGNOSIS — E119 Type 2 diabetes mellitus without complications: Secondary | ICD-10-CM

## 2020-09-07 DIAGNOSIS — Z833 Family history of diabetes mellitus: Secondary | ICD-10-CM

## 2020-09-07 DIAGNOSIS — Z818 Family history of other mental and behavioral disorders: Secondary | ICD-10-CM

## 2020-09-07 DIAGNOSIS — F1410 Cocaine abuse, uncomplicated: Secondary | ICD-10-CM | POA: Diagnosis present

## 2020-09-07 DIAGNOSIS — Z79899 Other long term (current) drug therapy: Secondary | ICD-10-CM | POA: Diagnosis not present

## 2020-09-07 DIAGNOSIS — E039 Hypothyroidism, unspecified: Secondary | ICD-10-CM | POA: Diagnosis present

## 2020-09-07 DIAGNOSIS — Z794 Long term (current) use of insulin: Secondary | ICD-10-CM | POA: Diagnosis not present

## 2020-09-07 DIAGNOSIS — Z9151 Personal history of suicidal behavior: Secondary | ICD-10-CM

## 2020-09-07 DIAGNOSIS — F319 Bipolar disorder, unspecified: Secondary | ICD-10-CM

## 2020-09-07 DIAGNOSIS — F1121 Opioid dependence, in remission: Secondary | ICD-10-CM | POA: Diagnosis present

## 2020-09-07 DIAGNOSIS — Z79890 Hormone replacement therapy (postmenopausal): Secondary | ICD-10-CM

## 2020-09-07 DIAGNOSIS — T50904A Poisoning by unspecified drugs, medicaments and biological substances, undetermined, initial encounter: Secondary | ICD-10-CM | POA: Diagnosis not present

## 2020-09-07 DIAGNOSIS — E1140 Type 2 diabetes mellitus with diabetic neuropathy, unspecified: Secondary | ICD-10-CM | POA: Diagnosis present

## 2020-09-07 DIAGNOSIS — F17210 Nicotine dependence, cigarettes, uncomplicated: Secondary | ICD-10-CM | POA: Diagnosis present

## 2020-09-07 DIAGNOSIS — Z7984 Long term (current) use of oral hypoglycemic drugs: Secondary | ICD-10-CM

## 2020-09-07 DIAGNOSIS — F3181 Bipolar II disorder: Principal | ICD-10-CM | POA: Diagnosis present

## 2020-09-07 DIAGNOSIS — T50902A Poisoning by unspecified drugs, medicaments and biological substances, intentional self-harm, initial encounter: Secondary | ICD-10-CM | POA: Diagnosis not present

## 2020-09-07 DIAGNOSIS — F332 Major depressive disorder, recurrent severe without psychotic features: Secondary | ICD-10-CM | POA: Diagnosis present

## 2020-09-07 LAB — BASIC METABOLIC PANEL
Anion gap: 9 (ref 5–15)
BUN: 21 mg/dL — ABNORMAL HIGH (ref 6–20)
CO2: 23 mmol/L (ref 22–32)
Calcium: 9.6 mg/dL (ref 8.9–10.3)
Chloride: 99 mmol/L (ref 98–111)
Creatinine, Ser: 0.78 mg/dL (ref 0.44–1.00)
GFR, Estimated: >60 mL/min (ref >60)
Glucose, Bld: 270 mg/dL — ABNORMAL HIGH (ref 70–99)
Potassium: 4.2 mmol/L (ref 3.5–5.1)
Sodium: 131 mmol/L — ABNORMAL LOW (ref 135–145)

## 2020-09-07 LAB — GLUCOSE, CAPILLARY
Glucose-Capillary: 243 mg/dL — ABNORMAL HIGH (ref 70–99)
Glucose-Capillary: 277 mg/dL — ABNORMAL HIGH (ref 70–99)
Glucose-Capillary: 346 mg/dL — ABNORMAL HIGH (ref 70–99)
Glucose-Capillary: 254 mg/dL — ABNORMAL HIGH (ref 70–99)
Glucose-Capillary: 290 mg/dL — ABNORMAL HIGH (ref 70–99)

## 2020-09-07 LAB — OSMOLALITY: Osmolality: 294 mOsm/kg (ref 275–295)

## 2020-09-07 LAB — SODIUM: Sodium: 131 mmol/L — ABNORMAL LOW (ref 135–145)

## 2020-09-07 MED ORDER — MAGNESIUM HYDROXIDE 400 MG/5ML PO SUSP: 30.0000 mL | Freq: Every day | ORAL | Status: DC | PRN

## 2020-09-07 MED ORDER — INSULIN ASPART 100 UNIT/ML ~~LOC~~ SOLN
0.0000 [IU] | Freq: Every day | SUBCUTANEOUS | Status: DC
  Administered 2020-09-07: 22:00:00 3 [IU] via SUBCUTANEOUS
  Filled 2020-09-07: qty 1

## 2020-09-07 MED ORDER — METFORMIN HCL 500 MG PO TABS
1000.0000 mg | ORAL_TABLET | Freq: Two times a day (BID) | ORAL | Status: DC
  Administered 2020-09-08 – 2020-09-11 (×7): 1000 mg via ORAL
  Filled 2020-09-07 (×7): qty 2

## 2020-09-07 MED ORDER — HYDROXYZINE HCL 25 MG PO TABS
25.0000 mg | ORAL_TABLET | Freq: Three times a day (TID) | ORAL | Status: DC | PRN
  Administered 2020-09-07 – 2020-09-10 (×5): 25 mg via ORAL
  Filled 2020-09-07 (×5): qty 1

## 2020-09-07 MED ORDER — ACETAMINOPHEN 325 MG PO TABS
650.0000 mg | ORAL_TABLET | Freq: Four times a day (QID) | ORAL | Status: DC | PRN
  Administered 2020-09-10 (×2): 650 mg via ORAL
  Filled 2020-09-07 (×2): qty 2

## 2020-09-07 MED ORDER — ALUM & MAG HYDROXIDE-SIMETH 200-200-20 MG/5ML PO SUSP
30.0000 mL | ORAL | Status: DC | PRN
  Administered 2020-09-08: 30 mL via ORAL
  Filled 2020-09-07: qty 30

## 2020-09-07 MED ORDER — INSULIN ASPART 100 UNIT/ML ~~LOC~~ SOLN
5.0000 [IU] | Freq: Three times a day (TID) | SUBCUTANEOUS | Status: DC
  Administered 2020-09-07: 14:00:00 5 [IU] via SUBCUTANEOUS
  Filled 2020-09-07: qty 1

## 2020-09-07 MED ORDER — INSULIN ASPART 100 UNIT/ML ~~LOC~~ SOLN
0.0000 [IU] | Freq: Three times a day (TID) | SUBCUTANEOUS | Status: DC
  Filled 2020-09-07: qty 1

## 2020-09-07 MED ORDER — INSULIN ASPART 100 UNIT/ML ~~LOC~~ SOLN: 10.0000 [IU] | Freq: Three times a day (TID) | SUBCUTANEOUS | Status: DC

## 2020-09-07 MED ORDER — GABAPENTIN 400 MG PO CAPS
800.0000 mg | ORAL_CAPSULE | Freq: Four times a day (QID) | ORAL | Status: DC
  Administered 2020-09-08 – 2020-09-11 (×14): 800 mg via ORAL
  Filled 2020-09-07 (×14): qty 2

## 2020-09-07 MED ORDER — METFORMIN HCL 500 MG PO TABS
1000.0000 mg | ORAL_TABLET | Freq: Two times a day (BID) | ORAL | Status: DC
  Administered 2020-09-07: 19:00:00 1000 mg via ORAL
  Filled 2020-09-07 (×2): qty 2

## 2020-09-07 MED ORDER — TRAZODONE HCL 50 MG PO TABS
50.0000 mg | ORAL_TABLET | Freq: Every evening | ORAL | Status: DC | PRN
  Administered 2020-09-07 – 2020-09-10 (×4): 50 mg via ORAL
  Filled 2020-09-07 (×4): qty 1

## 2020-09-07 MED ORDER — DIVALPROEX SODIUM 250 MG PO DR TAB
250.0000 mg | DELAYED_RELEASE_TABLET | Freq: Two times a day (BID) | ORAL | Status: DC
  Administered 2020-09-07: 250 mg via ORAL
  Filled 2020-09-07 (×2): qty 1

## 2020-09-07 MED ORDER — SODIUM CHLORIDE 0.9 % IV SOLN: INTRAVENOUS | Status: DC

## 2020-09-07 MED ORDER — MIRTAZAPINE 15 MG PO TABS
15.0000 mg | ORAL_TABLET | Freq: Every day | ORAL | Status: DC
  Administered 2020-09-07: 15 mg via ORAL
  Filled 2020-09-07: qty 1

## 2020-09-07 MED ORDER — INSULIN ASPART 100 UNIT/ML ~~LOC~~ SOLN
8.0000 [IU] | Freq: Three times a day (TID) | SUBCUTANEOUS | Status: DC
  Filled 2020-09-07: qty 1

## 2020-09-07 MED ORDER — SODIUM CHLORIDE 0.9 % IV BOLUS: 500.0000 mL | Freq: Once | INTRAVENOUS | Status: DC

## 2020-09-07 NOTE — Tx Team (Signed)
Initial Treatment Plan 09/07/2020 11:53 PM [name] MVV:[id number]    PATIENT STRESSORS: Medication change or noncompliance Substance abuse   PATIENT STRENGTHS: Motivation for treatment/growth Supportive family/friends   PATIENT IDENTIFIED PROBLEMS: Depressed  Suicidal Ideation  Anxiety                 DISCHARGE CRITERIA:  Improved stabilization in mood, thinking, and/or behavior Verbal commitment to aftercare and medication compliance  PRELIMINARY DISCHARGE PLAN: Outpatient therapy Return to previous living arrangement  PATIENT/FAMILY INVOLVEMENT: This treatment plan has been presented to and reviewed with the patient, Stephanie Barry. The patient has been given the opportunity to ask questions and make suggestions.  Elmyra Ricks, RN 09/07/2020, 11:53 PM

## 2020-09-07 NOTE — Progress Notes (Signed)
Patient admitted from [facility], report received from Tehaleh, Charity fundraiser. Patient had an overdose of diphenhydramine but wasn't sure why she did it. Patient visible irritable and agitated during assessment. Patient endorses passive SI, verbally contracts for safety. Patient denies AVH. Patient endorses polysubstance abuse and states she hasn't been sleeping. Patient given PRN medications per MD orders. Patient oriented to the unit and her room and given snack. Patient given education, support, and encouragement to be active in her treatment plan. Patient being monitored Q 15 minutes for safety per unit protocol. Patient remains safe on the unit.

## 2020-09-07 NOTE — Progress Notes (Signed)
   09/07/20 1625  Assess: MEWS Score  Temp 98.8 F (37.1 C)  BP 120/65  Pulse Rate (!) 111  Resp 20  SpO2 98 %  O2 Device Room Air  Assess: MEWS Score  MEWS Temp 0  MEWS Systolic 0  MEWS Pulse 2  MEWS RR 0  MEWS LOC 0  MEWS Score 2  MEWS Score Color Yellow  Assess: if the MEWS score is Yellow or Red  Were vital signs taken at a resting state? Yes  Focused Assessment No change from prior assessment  Early Detection of Sepsis Score *See Row Information* Low  MEWS guidelines implemented *See Row Information* Yes  Treat  MEWS Interventions Administered scheduled meds/treatments  Pain Scale 0-10  Pain Score 0  Take Vital Signs  Increase Vital Sign Frequency  Yellow: Q 2hr X 2 then Q 4hr X 2, if remains yellow, continue Q 4hrs  Escalate  MEWS: Escalate Yellow: discuss with charge nurse/RN and consider discussing with provider and RRT  Notify: Charge Nurse/RN  Name of Charge Nurse/RN Notified  Theodosia Quay, RN)  Date Charge Nurse/RN Notified 09/07/20  Time Charge Nurse/RN Notified 1648  Notify: Provider  Provider Name/Title  Denton Lank, Tresa Endo)  Date Provider Notified 09/07/20  Time Provider Notified 1649  Notification Type Page  Notification Reason Change in status  Response No new orders  Date of Provider Response 09/07/20  Time of Provider Response 1649  Document  Patient Outcome Stabilized after interventions  Progress note created (see row info) Yes  copy and pasted per Jake Bathe RN

## 2020-09-07 NOTE — Plan of Care (Signed)
Patient new to the unit tonight, hasn't had time to progress   Problem: Education: Goal: Knowledge of [facility] Education information/materials will improve Outcome: Not Progressing Goal: Emotional status will improve Outcome: Not Progressing Goal: Mental status will improve Outcome: Not Progressing Goal: Verbalization of understanding the information provided will improve Outcome: Not Progressing   Problem: Safety: Goal: Periods of time without injury will increase Outcome: Not Progressing   Problem: Education: Goal: Ability to make informed decisions regarding treatment will improve Outcome: Not Progressing   Problem: Self-Concept: Goal: Ability to disclose and discuss suicidal ideas will improve Outcome: Not Progressing Goal: Will verbalize positive feelings about self Outcome: Not Progressing   

## 2020-09-07 NOTE — Progress Notes (Signed)
Patient flagged a yellow MEWS HR 111, RR 20, notified MD and charge nurse. No new orders, patient had previously refused her IV normal saline bolus, MD aware.

## 2020-09-07 NOTE — Progress Notes (Signed)
[facility] MD Progress Note  09/07/2020 3:07 PM Stephanie Barry  MRN:  [id number] Subjective: Follow-up for this 39 year old woman who came to the hospital after overdose of diphenhydramine.  Patient seen chart reviewed.  Patient presents as disorganized in her thinking with rapid tangential speech difficulty explaining her current situation.  She is very fidgety throughout the conversation.  Affect is labile with spells of irritability mixed with tearfulness.  Patient admits to having taken an extremely large amount of Benadryl because she says that she will go for days without sleeping.  Not currently seeing an outpatient psychiatrist.  Has had previous hospitalizations and multiple overdoses.  Insight is poor. Principal Problem: Bipolar depression (HCC) Diagnosis: Principal Problem:   Bipolar depression (HCC) Active Problems:   Hypothyroidism   Cannabis use disorder, moderate, dependence (HCC)   Polysubstance abuse (HCC)   Diabetes mellitus without complication (HCC)   Overdose   Cocaine abuse (HCC)   HTN (hypertension)   Depression   GERD (gastroesophageal reflux disease)   Hypokalemia   Intentional overdose of drug in tablet form (HCC)  Total Time spent with patient: 30 minutes  Past Psychiatric History: Past history of psychiatric hospitalizations and suicide attempts overdoses  Past Medical History:  Past Medical History:  Diagnosis Date  . Diabetes mellitus without complication (HCC)   . Hepatitis C   . Thyroid disease     Past Surgical History:  Procedure Laterality Date  . TUBAL LIGATION     Family History:  Family History  Problem Relation Age of Onset  . Diabetes Mellitus II Mother   . Diabetes Mellitus II Father    Family Psychiatric  History: Talks about a cousin who killed themselves.  Other family members with mood problems and substance abuse issues Social History:  Social History   Substance and Sexual Activity  Alcohol Use No     Social History   Substance  and Sexual Activity  Drug Use Yes  . Types: Marijuana, Cocaine    Social History   Socioeconomic History  . Marital status: Married    Spouse name: Peyton Najjar   . Number of children: 5  . Years of education: Not on file  . Highest education level: Not on file  Occupational History  . Not on file  Tobacco Use  . Smoking status: Former Games developer  . Smokeless tobacco: Never Used  Vaping Use  . Vaping Use: Never used  Substance and Sexual Activity  . Alcohol use: No  . Drug use: Yes    Types: Marijuana, Cocaine  . Sexual activity: Yes  Other Topics Concern  . Not on file  Social History Narrative  . Not on file   Social Determinants of Health   Financial Resource Strain:   . Difficulty of Paying Living Expenses: Not on file  Food Insecurity:   . Worried About Programme researcher, broadcasting/film/video in the Last Year: Not on file  . Ran Out of Food in the Last Year: Not on file  Transportation Needs:   . Lack of Transportation (Medical): Not on file  . Lack of Transportation (Non-Medical): Not on file  Physical Activity:   . Days of Exercise per Week: Not on file  . Minutes of Exercise per Session: Not on file  Stress:   . Feeling of Stress : Not on file  Social Connections:   . Frequency of Communication with Friends and Family: Not on file  . Frequency of Social Gatherings with Friends and Family: Not on file  .  Attends Religious Services: Not on file  . Active Member of Clubs or Organizations: Not on file  . Attends Banker Meetings: Not on file  . Marital Status: Not on file   Additional Social History:                         Sleep: Fair  Appetite:  Fair  Current Medications: Current Facility-Administered Medications  Medication Dose Route Frequency Provider Last Rate Last Admin  . acetaminophen (TYLENOL) tablet 650 mg  650 mg Oral Q6H PRN Lorretta Harp, MD   650 mg at 09/06/20 0136   Or  . acetaminophen (TYLENOL) suppository 650 mg  650 mg Rectal Q6H PRN Lorretta Harp, MD      . albuterol (PROVENTIL) (2.5 MG/3ML) 0.083% nebulizer solution 2.5 mg  2.5 mg Nebulization Q4H PRN Lorretta Harp, MD      . buprenorphine-naloxone (SUBOXONE) 8-2 mg per SL tablet 1 tablet  1 tablet Sublingual TID Esaw Grandchild A, DO   1 tablet at 09/07/20 0915  . Chlorhexidine Gluconate Cloth 2 % PADS 6 each  6 each Topical Daily Lorretta Harp, MD   6 each at 09/07/20 1350  . enoxaparin (LOVENOX) injection 40 mg  40 mg Subcutaneous Q24H Lorretta Harp, MD   40 mg at 09/06/20 2118  . gabapentin (NEURONTIN) capsule 800 mg  800 mg Oral QID Esaw Grandchild A, DO   800 mg at 09/07/20 1349  . hydrALAZINE (APRESOLINE) injection 5 mg  5 mg Intravenous Q2H PRN Lorretta Harp, MD      . insulin aspart (novoLOG) injection 0-15 Units  0-15 Units Subcutaneous TID WC Esaw Grandchild A, DO      . insulin aspart (novoLOG) injection 0-5 Units  0-5 Units Subcutaneous QHS Esaw Grandchild A, DO      . insulin aspart (novoLOG) injection 8 Units  8 Units Subcutaneous TID WC Esaw Grandchild A, DO      . insulin glargine (LANTUS) injection 20 Units  20 Units Subcutaneous Daily Esaw Grandchild A, DO   20 Units at 09/07/20 0917  . levothyroxine (SYNTHROID) tablet 100 mcg  100 mcg Oral Daily Lorretta Harp, MD   100 mcg at 09/07/20 0600  . lisinopril (ZESTRIL) tablet 2.5 mg  2.5 mg Oral Daily Lorretta Harp, MD   2.5 mg at 09/07/20 0915  . metFORMIN (GLUCOPHAGE) tablet 1,000 mg  1,000 mg Oral BID WC Esaw Grandchild A, DO      . ondansetron (ZOFRAN) injection 4 mg  4 mg Intravenous Q8H PRN Lorretta Harp, MD      . pantoprazole (PROTONIX) EC tablet 40 mg  40 mg Oral Daily Lorretta Harp, MD   40 mg at 09/07/20 0915  . sodium chloride 0.9 % bolus 500 mL  500 mL Intravenous Once Esaw Grandchild A, DO      . trolamine salicylate (ASPERCREME) 10 % cream   Topical PRN Manuela Schwartz, NP        Lab Results:  Results for orders placed or performed during the hospital encounter of 09/05/20 (from the past 48 hour(s))  Glucose, capillary      Status: None   Collection Time: 09/05/20  6:01 PM  Result Value Ref Range   Glucose-Capillary 74 70 - 99 mg/dL    Comment: Glucose reference range applies only to samples taken after fasting for at least 8 hours.  Basic metabolic panel     Status: Abnormal   Collection Time: 09/06/20  5:09 AM  Result Value Ref Range   Sodium 134 (L) 135 - 145 mmol/L   Potassium 4.3 3.5 - 5.1 mmol/L   Chloride 101 98 - 111 mmol/L   CO2 25 22 - 32 mmol/L   Glucose, Bld 333 (H) 70 - 99 mg/dL    Comment: Glucose reference range applies only to samples taken after fasting for at least 8 hours.   BUN 19 6 - 20 mg/dL   Creatinine, Ser 1.61 0.44 - 1.00 mg/dL   Calcium 9.5 8.9 - 09.6 mg/dL   GFR, Estimated >04 >54 mL/min    Comment: (NOTE) Calculated using the CKD-EPI Creatinine Equation (2021)    Anion gap 8 5 - 15    Comment: Performed at [facility], [address] [place], [place] [address]  CBC     Status: Abnormal   Collection Time: 09/06/20  5:09 AM  Result Value Ref Range   WBC 6.8 4.0 - 10.5 K/uL   RBC 3.79 (L) 3.87 - 5.11 MIL/uL   Hemoglobin 10.4 (L) 12.0 - 15.0 g/dL   HCT 91.4 (L) 36 - 46 %   MCV 86.5 80.0 - 100.0 fL   MCH 27.4 26.0 - 34.0 pg   MCHC 31.7 30.0 - 36.0 g/dL   RDW 78.2 (H) 95.6 - 21.3 %   Platelets 209 150 - 400 K/uL   nRBC 0.0 0.0 - 0.2 %    Comment: Performed at [facility], [address], [place], [place] [address]  Magnesium     Status: None   Collection Time: 09/06/20  5:09 AM  Result Value Ref Range   Magnesium 2.0 1.7 - 2.4 mg/dL    Comment: Performed at [facility], [address] [place], [place] [address]  Glucose, capillary     Status: Abnormal   Collection Time: 09/06/20  8:30 AM  Result Value Ref Range   Glucose-Capillary 246 (H) 70 - 99 mg/dL    Comment: Glucose reference range applies only to samples taken after fasting for at least 8 hours.  Glucose, capillary     Status: Abnormal   Collection Time: 09/06/20 12:07 PM   Result Value Ref Range   Glucose-Capillary 287 (H) 70 - 99 mg/dL    Comment: Glucose reference range applies only to samples taken after fasting for at least 8 hours.  Glucose, capillary     Status: Abnormal   Collection Time: 09/06/20  3:33 PM  Result Value Ref Range   Glucose-Capillary 193 (H) 70 - 99 mg/dL    Comment: Glucose reference range applies only to samples taken after fasting for at least 8 hours.  Glucose, capillary     Status: Abnormal   Collection Time: 09/06/20  8:14 PM  Result Value Ref Range   Glucose-Capillary 243 (H) 70 - 99 mg/dL    Comment: Glucose reference range applies only to samples taken after fasting for at least 8 hours.   Comment 1 Notify RN   Basic metabolic panel     Status: Abnormal   Collection Time: 09/07/20  3:16 AM  Result Value Ref Range   Sodium 131 (L) 135 - 145 mmol/L   Potassium 4.2 3.5 - 5.1 mmol/L   Chloride 99 98 - 111 mmol/L   CO2 23 22 - 32 mmol/L   Glucose, Bld 270 (H) 70 - 99 mg/dL    Comment: Glucose reference range applies only to samples taken after fasting for at least 8 hours.   BUN 21 (  H) 6 - 20 mg/dL   Creatinine, Ser 1.610.78 0.44 - 1.00 mg/dL   Calcium 9.6 8.9 - 09.610.3 mg/dL   GFR, Estimated >04>60 >54>60 mL/min    Comment: (NOTE) Calculated using the CKD-EPI Creatinine Equation (2021)    Anion gap 9 5 - 15    Comment: Performed at [facility], [address] [place], [place] [address]  Osmolality     Status: None   Collection Time: 09/07/20  3:16 AM  Result Value Ref Range   Osmolality 294 275 - 295 mOsm/kg    Comment: Performed at [facility], [address] [place], [place] [address]  Glucose, capillary     Status: Abnormal   Collection Time: 09/07/20  8:30 AM  Result Value Ref Range   Glucose-Capillary 277 (H) 70 - 99 mg/dL    Comment: Glucose reference range applies only to samples taken after fasting for at least 8 hours.   Comment 1 Notify RN   Glucose, capillary     Status: Abnormal    Collection Time: 09/07/20 12:39 PM  Result Value Ref Range   Glucose-Capillary 346 (H) 70 - 99 mg/dL    Comment: Glucose reference range applies only to samples taken after fasting for at least 8 hours.  Sodium     Status: Abnormal   Collection Time: 09/07/20  1:09 PM  Result Value Ref Range   Sodium 131 (L) 135 - 145 mmol/L    Comment: Performed at [facility], [address] [place], [place] [address]    Blood Alcohol level:  Lab Results  Component Value Date   [facility]   ETH <10 06/13/2020    Metabolic Disorder Labs: Lab Results  Component Value Date   HGBA1C 11.8 (H) 04/07/2019   MPG 291.96 04/07/2019   No results found for: PROLACTIN Lab Results  Component Value Date   CHOL 201 (H) 04/07/2019   TRIG 466 (H) 04/07/2019   HDL 23 (L) 04/07/2019   CHOLHDL 8.7 04/07/2019   VLDL UNABLE TO CALCULATE IF TRIGLYCERIDE OVER 400 mg/dL 13/08/657806/05/2019   LDLCALC UNABLE TO CALCULATE IF TRIGLYCERIDE OVER 400 mg/dL 46/96/295206/05/2019    Physical Findings: AIMS:  , ,  ,  ,    CIWA:    COWS:     Musculoskeletal: Strength & Muscle Tone: within normal limits Gait & Station: normal Patient leans: N/A  Psychiatric Specialty Exam: Physical Exam Vitals and nursing note reviewed.  Constitutional:      Appearance: She is well-developed.  HENT:     Head: Normocephalic and atraumatic.  Eyes:     Conjunctiva/sclera: Conjunctivae normal.     Pupils: Pupils are equal, round, and reactive to light.  Cardiovascular:     Heart sounds: Normal heart sounds.  Pulmonary:     Effort: Pulmonary effort is normal.  Abdominal:     Palpations: Abdomen is soft.  Musculoskeletal:        General: Normal range of motion.     Cervical back: Normal range of motion.  Skin:    General: Skin is warm and dry.  Neurological:     General: No focal deficit present.     Mental Status: She is alert.  Psychiatric:        Attention and Perception: She is inattentive.        Mood and Affect:  Mood is anxious. Affect is labile.        Speech: Speech is tangential.  Behavior: Behavior is agitated. Behavior is not aggressive.        Thought Content: Thought content is paranoid. Thought content does not include homicidal or suicidal ideation.        Cognition and Memory: Cognition is impaired. Memory is impaired.        Judgment: Judgment is inappropriate.     Review of Systems  Constitutional: Negative.   HENT: Negative.   Eyes: Negative.   Respiratory: Negative.   Cardiovascular: Negative.   Gastrointestinal: Negative.   Musculoskeletal: Negative.   Skin: Negative.   Neurological: Negative.   Psychiatric/Behavioral: Positive for sleep disturbance.    Blood pressure 122/80, pulse (!) 105, temperature 98.9 F (37.2 C), temperature source Oral, resp. rate 20, height 5\' 7"  (1.702 m), weight 83.9 kg, SpO2 96 %.Body mass index is 28.98 kg/m.  General Appearance: Casual  Eye Contact:  Minimal  Speech:  Garbled and Pressured  Volume:  Increased  Mood:  Angry, Anxious and Irritable  Affect:  Labile and Tearful  Thought Process:  Disorganized  Orientation:  Full (Time, Place, and Person)  Thought Content:  Illogical, Rumination and Tangential  Suicidal Thoughts:  No  Homicidal Thoughts:  No  Memory:  Immediate;   Fair Recent;   Poor Remote;   Poor  Judgement:  Impaired  Insight:  Shallow  Psychomotor Activity:  Restlessness  Concentration:  Concentration: Poor  Recall:  Poor  Fund of Knowledge:  Fair  Language:  Fair  Akathisia:  No  Handed:  Right  AIMS (if indicated):     Assets:  Resilience  ADL's:  Impaired  Cognition:  Impaired,  Mild  Sleep:        Treatment Plan Summary: Daily contact with patient to assess and evaluate symptoms and progress in treatment, Medication management and Plan This is a 39 year old woman with mood instability multiple overdoses and suicide attempts ongoing substance abuse and poor insight.  Patient does not have appropriate  outpatient treatment set up and is at high risk for recurrent dangerous behavior.  I support the idea that she should be admitted to the psychiatric unit.  Case reviewed with TTS and hospitalist.  We will try to find disposition to another psychiatric ward as soon as possible.  Meanwhile I am going to stop antidepressants which I think are probably not the best medicine and see if something more for bipolar would be helpful  20, MD 09/07/2020, 3:07 PM

## 2020-09-07 NOTE — BH Assessment (Signed)
Patient is to be admitted to [facility] by Dr. Toni Amend.  Attending Physician will be Dr.  Neale Burly .   Patient has been assigned to room 304, by [facility] Charge Nurse [place].   Intake Paper Work has been signed and placed on patient chart.  ER staff is aware of the admission:  Jola Babinski, ER Secretary    [place], Patient's Nurse   Leotis Shames, Patient Access.

## 2020-09-07 NOTE — Progress Notes (Signed)
Pt is alert and orientedX4 and pleasant. She exhibits with mild anxiety and shaky movements that is her admission baseline. Pts vitals are WNL with exceptions to her pulse as she states that she feels uneasy due to the room change. Pt is given reassurance of exceptional care as she is being transferred to another unit. Pt is able to self transfer to wheelchair and is safely transported by staff to behavioral health unit.

## 2020-09-07 NOTE — Progress Notes (Signed)
PROGRESS NOTE    Stephanie Barry   IEP:[id number]  DOB: 1981-01-02  PCP: [facility] Personal    DOA: 09/05/2020 LOS: 1   Brief Narrative   Stephanie Barry is a 39 y.o. female with medical history significant of hypertension, diabetes mellitus, GERD, hypothyroidism, depression, HCV, polysubstance abuse (opiates, cannabis, cocaine, benzo, cocaine), suicidal, who presented to the ED on 09/05/20 with altered mental status due to Benadryl overdose.  Patient's mother reported to admitting physician that this was a suicide attempt, estimated pt took over 100 tablets of Benadryl.    Patient IVC'd in the ED. Psychiatry consulted. Poison control recommended supportive care, monitoring of Qtc.  Admitted to ICU/stepdown with 1:1 sitter.  Required restraints initially and had severe agitation with many reports of patient verbally assaulting staff and refusing care.  11/7 - pt off restraints, calm, but irritable about being kept in hospital.  Seen by psychiatry today.  Plan is to discharge to behavioral health unit pending medical clearance.  Due to hyperglycemia and still elevated Qtc, will continue acute hospital stay for today.      Assessment & Plan   Principal Problem:   Overdose Active Problems:   Hypothyroidism   Cannabis use disorder, moderate, dependence (HCC)   Polysubstance abuse (HCC)   Diabetes mellitus without complication (HCC)   Cocaine abuse (HCC)   HTN (hypertension)   Depression   GERD (gastroesophageal reflux disease)   Hypokalemia   Intentional overdose of drug in tablet form (HCC)   Overdose - Took unknown quantity of Benadryl (over 100 per mother).  Pt denies suicide attempt but is quite withdrawn in discussion.  Family report this was suicide attempt.   On admission, CK 189, alcohol level <10, Tylenol <10, salicylate <7.   QTc prolonged at 496 >> Resolved.  QTc 453 on EKG this morning. --Psychiatry, consulted --monitor QTc, vitals  etc. --continue 1:1 sitter --neuro checks --admit to [facility] when medically clear   Hyponatremia - mild present on admission. Na trend: 134>>131>>131.  IV fluid challenge 500 cc bolus.  Repeat sodium level after.  Follow up on serum & urine osmolality and urine lytes.  Monitor BMP.  Suspect partially related to hyperglycemia.  Diabetes mellitus with peripheral neuropathy- Recent A1c 11.8, poorly controlled.  Hold Victoza and Jardiance. Resume metformin.  Continue home Lantus 20 units daily.  Sliding scale Novolog - increased to moderate.  Added schedule Novolog 8 units TID WC.  Continue gabapentin.  Changed diet to regular on patient's insistence.  Depression with suicidal attempt - Remeron and Effexor initially held - resumed per psychiatry today.  Psych following.  Hypothyroidism - Synthroid  Polysubstance abuse, cannabis use disorder, moderate, dependence and cocaine abuse - Counseled regarding importance of cessation.  Psych following.  Hypertension - IV hydralazine as needed.  Continue Lisinopril  Opioid use disorder - resumed Suboxone today  GERD - Protonix    DVT prophylaxis: enoxaparin (LOVENOX) injection 40 mg Start: 09/05/20 2200   Diet:  Diet Orders (From admission, onward)    Start     Ordered   09/06/20 0924  Diet regular Room service appropriate? Yes; Fluid consistency: Thin  Diet effective now       Question Answer Comment  Room service appropriate? Yes   Fluid consistency: Thin      09/06/20 0923            Code Status: Full Code    Subjective 09/07/20    Pt seen in at bedside with sitter  present.  She was cheerful, had just been on phone with a family member.  Denies suicidal ideations, continues to insist her mother exaggerated and that she was not attempting suicide.  Reports very good appetite.  No acute complaints.  She does become quite tearful about idea of going from acute hospital to behavioral health.  Says she really needs to get home to her  kids and husband.     Disposition Plan & Communication   Status is: Inpatient  Remains inpatient appropriate because: patient is under IVC for suicide attempt by overdose, [facility].  Continues to have hyperglycemia and mild hyponatremia which is being evaluated.   Dispo: The patient is from: Home              Anticipated d/c is to: Behavioral health unit              Anticipated d/c date is: 1 day              Patient currently is not medically stable to d/c.    Family Communication: none at bedside.     Consults, Procedures, Significant Events   Consultants:   Psychiatry  Procedures:   none  Antimicrobials:  Anti-infectives (From admission, onward)   None        Objective   Vitals:   09/06/20 1533 09/06/20 2018 09/07/20 0802 09/07/20 1232  BP: (!) 145/98 123/72 135/76 122/80  Pulse: 99 99 (!) 109 (!) 105  Resp:   20 20  Temp:  99.2 F (37.3 C) 98.5 F (36.9 C) 98.9 F (37.2 C)  TempSrc:  Oral Oral Oral  SpO2: 97% 94% 96% 96%  Weight:      Height:       No intake or output data in the 24 hours ending 09/07/20 1417 Filed Weights   09/05/20 0450  Weight: 83.9 kg    Physical Exam:  General exam: awake, alert, no acute distress Respiratory system: CTAB, no wheezes, rales or rhonchi, normal respiratory effort. Cardiovascular system: normal S1/S2, RRR, no JVD, murmurs, rubs, gallops, no pedal edema.   Central nervous system: A&O x4. no gross focal neurologic deficits, normal speech Psychiatry: normal mood overall but labile (tearful on discussing BH admission rec), congruent affect  Labs   Data Reviewed: I have personally reviewed following labs and imaging studies  CBC: Recent Labs  Lab 09/05/20 0257 09/06/20 0509  WBC 10.7* 6.8  HGB 10.0* 10.4*  HCT 32.2* 32.8*  MCV 85.9 86.5  PLT 206 209   Basic Metabolic Panel: Recent Labs  Lab 09/05/20 0257 09/06/20 0509 09/07/20 0316 09/07/20 1309  NA 134* 134*  131* 131*  K 3.2* 4.3 4.2  --   CL 98 101 99  --   CO2 24 25 23   --   GLUCOSE 137* 333* 270*  --   BUN 13 19 21*  --   CREATININE 0.79 0.75 0.78  --   CALCIUM 9.8 9.5 9.6  --   MG 2.0 2.0  --   --    GFR: Estimated Creatinine Clearance: 106.1 mL/min (by C-G formula based on SCr of 0.78 mg/dL). Liver Function Tests: Recent Labs  Lab 09/05/20 0257  AST 23  ALT 14  ALKPHOS 53  BILITOT 0.6  PROT 8.8*  ALBUMIN 4.6   No results for input(s): LIPASE, AMYLASE in the last 168 hours. No results for input(s): AMMONIA in the last 168 hours. Coagulation Profile: No results for input(s):  INR, PROTIME in the last 168 hours. Cardiac Enzymes: Recent Labs  Lab 09/05/20 0257  CKTOTAL 189   BNP (last 3 results) No results for input(s): PROBNP in the last 8760 hours. HbA1C: No results for input(s): HGBA1C in the last 72 hours. CBG: Recent Labs  Lab 09/06/20 1207 09/06/20 1533 09/06/20 2014 09/07/20 0830 09/07/20 1239  GLUCAP 287* 193* 243* 277* 346*   Lipid Profile: No results for input(s): CHOL, HDL, LDLCALC, TRIG, CHOLHDL, LDLDIRECT in the last 72 hours. Thyroid Function Tests: No results for input(s): TSH, T4TOTAL, FREET4, T3FREE, THYROIDAB in the last 72 hours. Anemia Panel: No results for input(s): VITAMINB12, FOLATE, FERRITIN, TIBC, IRON, RETICCTPCT in the last 72 hours. Sepsis Labs: No results for input(s): PROCALCITON, LATICACIDVEN in the last 168 hours.  Recent Results (from the past 240 hour(s))  Respiratory Panel by RT PCR (Flu A&B, Covid) - Nasopharyngeal Swab     Status: None   Collection Time: 09/05/20  3:07 AM   Specimen: Nasopharyngeal Swab  Result Value Ref Range Status   SARS Coronavirus 2 by RT PCR NEGATIVE NEGATIVE Final    Comment: (NOTE) SARS-CoV-2 target nucleic acids are NOT DETECTED.  The SARS-CoV-2 RNA is generally detectable in upper respiratoy specimens during the acute phase of infection. The lowest concentration of SARS-CoV-2 viral copies  this assay can detect is 131 copies/mL. A negative result does not preclude SARS-Cov-2 infection and should not be used as the sole basis for treatment or other patient management decisions. A negative result may occur with  improper specimen collection/handling, submission of specimen other than nasopharyngeal swab, presence of viral mutation(s) within the areas targeted by this assay, and inadequate number of viral copies (<131 copies/mL). A negative result must be combined with clinical observations, patient history, and epidemiological information. The expected result is Negative.  Fact Sheet for Patients:  https://www.moore.com/  Fact Sheet for Healthcare Providers:  https://www.young.biz/  This test is no t yet approved or cleared by the [place] FDA and  has been authorized for detection and/or diagnosis of SARS-CoV-2 by FDA under an Emergency Use Authorization (EUA). This EUA will remain  in effect (meaning this test can be used) for the duration of the COVID-19 declaration under Section 564(b)(1) of the Act, 21 U.S.C. section 360bbb-3(b)(1), unless the authorization is terminated or revoked sooner.     Influenza A by PCR NEGATIVE NEGATIVE Final   Influenza B by PCR NEGATIVE NEGATIVE Final    Comment: (NOTE) The Xpert Xpress SARS-CoV-2/FLU/RSV assay is intended as an aid in  the diagnosis of influenza from Nasopharyngeal swab specimens and  should not be used as a sole basis for treatment. Nasal washings and  aspirates are unacceptable for Xpert Xpress SARS-CoV-2/FLU/RSV  testing.  Fact Sheet for Patients: https://www.moore.com/  Fact Sheet for Healthcare Providers: https://www.young.biz/  This test is not yet approved or cleared by the [place] FDA and  has been authorized for detection and/or diagnosis of SARS-CoV-2 by  FDA under an Emergency Use Authorization (EUA). This EUA  will remain  in effect (meaning this test can be used) for the duration of the  Covid-19 declaration under Section 564(b)(1) of the Act, 21  U.S.C. section 360bbb-3(b)(1), unless the authorization is  terminated or revoked. Performed at [facility], [address] [place], [place] [address]       Imaging Studies   No results found.   Medications   Scheduled Meds: . buprenorphine-naloxone  1 tablet Sublingual TID  . Chlorhexidine Gluconate  Cloth  6 each Topical Daily  . enoxaparin (LOVENOX) injection  40 mg Subcutaneous Q24H  . gabapentin  800 mg Oral QID  . insulin aspart  0-9 Units Subcutaneous TID WC  . insulin aspart  5 Units Subcutaneous TID WC  . insulin glargine  20 Units Subcutaneous Daily  . levothyroxine  100 mcg Oral Daily  . lisinopril  2.5 mg Oral Daily  . pantoprazole  40 mg Oral Daily   Continuous Infusions: . sodium chloride         LOS: 1 day    Time spent: 25 minutes with > 50% spent in coordination of care and direct patient contact.    Pennie Banter, DO [facility]  09/07/2020, 2:17 PM    If 7PM-7AM, please contact night-coverage. How to contact the [facility] Attending or Consulting provider 7A - 7P or covering provider during after hours 7P -7A, for this patient?    1. Check the care team in [facility] and look for a) attending/consulting TR[facility] provider listed and b) the [facility] team listed 2. Log into [email or web address] and use [facility] universal password to access. If you do not have the password, please contact the hospital operator. 3. Locate the [facility] provider you are looking for under [facility] and page to a number that you can be directly reached. 4. If you still have difficulty reaching the provider, please page the [facility] (Director on Call) for the Hospitalists listed on amion for assistance.

## 2020-09-08 DIAGNOSIS — F3181 Bipolar II disorder: Secondary | ICD-10-CM | POA: Diagnosis present

## 2020-09-08 LAB — GLUCOSE, CAPILLARY
Glucose-Capillary: 315 mg/dL — ABNORMAL HIGH (ref 70–99)
Glucose-Capillary: 400 mg/dL — ABNORMAL HIGH (ref 70–99)
Glucose-Capillary: 299 mg/dL — ABNORMAL HIGH (ref 70–99)
Glucose-Capillary: 190 mg/dL — ABNORMAL HIGH (ref 70–99)

## 2020-09-08 MED ORDER — INSULIN GLARGINE 100 UNIT/ML ~~LOC~~ SOLN
25.0000 [IU] | Freq: Every day | SUBCUTANEOUS | Status: DC
  Administered 2020-09-08 – 2020-09-09 (×2): 25 [IU] via SUBCUTANEOUS
  Filled 2020-09-08 (×3): qty 0.25

## 2020-09-08 MED ORDER — INSULIN ASPART 100 UNIT/ML ~~LOC~~ SOLN
8.0000 [IU] | Freq: Three times a day (TID) | SUBCUTANEOUS | Status: DC
  Administered 2020-09-08 – 2020-09-09 (×4): 8 [IU] via SUBCUTANEOUS
  Filled 2020-09-08 (×4): qty 1

## 2020-09-08 MED ORDER — QUETIAPINE FUMARATE ER 50 MG PO TB24
100.0000 mg | ORAL_TABLET | Freq: Every day | ORAL | Status: DC
  Administered 2020-09-08: 100 mg via ORAL
  Filled 2020-09-08: qty 2

## 2020-09-08 MED ORDER — QUETIAPINE FUMARATE 25 MG PO TABS
25.0000 mg | ORAL_TABLET | Freq: Every day | ORAL | Status: DC
  Administered 2020-09-08: 25 mg via ORAL
  Filled 2020-09-08: qty 1

## 2020-09-08 MED ORDER — INSULIN GLARGINE 100 UNIT/ML ~~LOC~~ SOLN
20.0000 [IU] | Freq: Every day | SUBCUTANEOUS | Status: DC
  Filled 2020-09-08: qty 0.2

## 2020-09-08 MED ORDER — BUPRENORPHINE HCL-NALOXONE HCL 8-2 MG SL SUBL
1.0000 | SUBLINGUAL_TABLET | Freq: Three times a day (TID) | SUBLINGUAL | Status: DC
  Administered 2020-09-08 – 2020-09-11 (×10): 1 via SUBLINGUAL
  Filled 2020-09-08 (×10): qty 1

## 2020-09-08 MED ORDER — INSULIN ASPART 100 UNIT/ML ~~LOC~~ SOLN
0.0000 [IU] | Freq: Three times a day (TID) | SUBCUTANEOUS | Status: DC
  Administered 2020-09-08: 15 [IU] via SUBCUTANEOUS
  Administered 2020-09-08 – 2020-09-09 (×2): 8 [IU] via SUBCUTANEOUS
  Administered 2020-09-09: 5 [IU] via SUBCUTANEOUS
  Administered 2020-09-09 – 2020-09-10 (×2): 15 [IU] via SUBCUTANEOUS
  Administered 2020-09-10: 3 [IU] via SUBCUTANEOUS
  Administered 2020-09-10: 11 [IU] via SUBCUTANEOUS
  Administered 2020-09-11: 5 [IU] via SUBCUTANEOUS
  Administered 2020-09-11: 8 [IU] via SUBCUTANEOUS
  Filled 2020-09-08 (×10): qty 1
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
## 2020-09-08 NOTE — BHH Suicide Risk Assessment (Signed)
B[facility] INPATIENT:  Family/Significant Other Suicide Prevention Education  Suicide Prevention Education:  Education Completed; Domenic Moras, husband, [phone] has been identified by the patient as the family member/significant other with whom the patient will be residing, and identified as the person(s) who will aid the patient in the event of a mental health crisis (suicidal ideations/suicide attempt).  With written consent from the patient, the family member/significant other has been provided the following suicide prevention education, prior to the and/or following the discharge of the patient.  The suicide prevention education provided includes the following:  Suicide risk factors  Suicide prevention and interventions  National Suicide Hotline telephone number  [facility] assessment telephone number  [facility] Emergency Assistance 911  [facility] and/or Residential Mobile Crisis Unit telephone number  Request made of family/significant other to:  Remove weapons (e.g., guns, rifles, knives), all items previously/currently identified as safety concern.    Remove drugs/medications (over-the-counter, prescriptions, illicit drugs), all items previously/currently identified as a safety concern.  The family member/significant other verbalizes understanding of the suicide prevention education information provided.  The family member/significant other agrees to remove the items of safety concern listed above.  CSW spoke with the patients husband.  Husband reports that the patient "was just trying to get a little buzz and go to sleep".  He reports "she wasn't trying to kill herself, she just took too many".  He denies that patient has access to weapon or is a danger to self or others.    Harden Mo 09/08/2020, 11:08 AM

## 2020-09-08 NOTE — BHH Group Notes (Signed)
LCSW Group Therapy Note  09/08/2020 3:22 PM  Type of Therapy/Topic:  Group Therapy:  Feelings about Diagnosis  Participation Level:  Did Not Attend   Description of Group:   This group will allow patients to explore their thoughts and feelings about diagnoses they have received. Patients will be guided to explore their level of understanding and acceptance of these diagnoses. Facilitator will encourage patients to process their thoughts and feelings about the reactions of others to their diagnosis and will guide patients in identifying ways to discuss their diagnosis with significant others in their lives. This group will be process-oriented, with patients participating in exploration of their own experiences, giving and receiving support, and processing challenge from other group members.   Therapeutic Goals: 1. Patient will demonstrate understanding of diagnosis as evidenced by identifying two or more symptoms of the disorder 2. Patient will be able to express two feelings regarding the diagnosis 3. Patient will demonstrate their ability to communicate their needs through discussion and/or role play  Summary of Patient Progress: X  Therapeutic Modalities:   Cognitive Behavioral Therapy Brief Therapy Feelings Identification   Penni Homans, MSW, LCSW 09/08/2020 3:22 PM

## 2020-09-08 NOTE — [facility] Suicide Risk Assessment (Signed)
[facility] Admission Suicide Risk Assessment   Nursing information obtained from:  Patient Demographic factors:  Unemployed Current Mental Status:  Self-harm thoughts Loss Factors:  NA Historical Factors:  Prior suicide attempts Risk Reduction Factors:  Positive social support, Positive therapeutic relationship, Responsible for children under 38 years of age  Total Time spent with patient: 1 hour Principal Problem: Bipolar II disorder, severe, depressed, with anxious distress (HCC) Diagnosis:  Principal Problem:   Bipolar II disorder, severe, depressed, with anxious distress (HCC) Active Problems:   Hypothyroidism   Cannabis use disorder, moderate, dependence (HCC)   Diabetes (HCC)   Opioid use disorder, severe, in early remission (HCC)   Cocaine abuse (HCC)  Subjective Data: 39 year old female who presents after intentional ingestion of over 100 tablets of benadryl as suicide attempt in context of worsening mood and inability to sleep. Patient seen and assessed one-on-one at beside this morning. Her speech is pressured, and her thoughts are fairly tangential. However, she is able to be redirected to gather the following history. She notes that at times she will go 2-4 days without sleeping, feeling wide awake, and becoming severely irritable. She has been told in the past she has bipolar disorder, and believes that this is likely accurate. Her cousin and mother both suffer from bipolar disorder. In a previous hospitalization she was placed on Seroquel which she did feel helped with anxiety and sleep. She is agreeable to stopping Effexor and Mirtazapine to start Seroquel in its place. She notes that her mood is "all over the place" today. She has gone from good mood, to anxious, to tearful, to feeling that she wants to "beating someone to death in the face." She becomes tearful during exam stating that she wants her mood to be stable, and back to a normal sleep schedule so that she can care for her 5  children. She notes that she often takes her moods out on her husband of 15-years, but knows he does not deserve that treatment.   Continued Clinical Symptoms:  Alcohol Use Disorder Identification Test Final Score (AUDIT): 0 The "Alcohol Use Disorders Identification Test", Guidelines for Use in Primary Care, Second Edition.  World Science writer [facility]). Score between 0-7:  no or low risk or alcohol related problems. Score between 8-15:  moderate risk of alcohol related problems. Score between 16-19:  high risk of alcohol related problems. Score 20 or above:  warrants further diagnostic evaluation for alcohol dependence and treatment.   CLINICAL FACTORS:   Severe Anxiety and/or Agitation Bipolar Disorder:   Depressive phase Alcohol/Substance Abuse/Dependencies More than one psychiatric diagnosis Unstable or Poor Therapeutic Relationship Previous Psychiatric Diagnoses and Treatments Medical Diagnoses and Treatments/Surgeries   Musculoskeletal: Strength & Muscle Tone: within normal limits Gait & Station: normal Patient leans: Right  Psychiatric Specialty Exam: Physical Exam Vitals and nursing note reviewed.  Constitutional:      General: She is in acute distress.     Appearance: Normal appearance.  HENT:     Head: Normocephalic and atraumatic.     Right Ear: External ear normal.     Left Ear: External ear normal.     Nose: Nose normal.     Mouth/Throat:     Mouth: Mucous membranes are moist.     Pharynx: Oropharynx is clear.  Eyes:     Extraocular Movements: Extraocular movements intact.     Conjunctiva/sclera: Conjunctivae normal.     Pupils: Pupils are equal, round, and reactive to light.  Cardiovascular:  Rate and Rhythm: Tachycardia present.     Pulses: Normal pulses.  Pulmonary:     Effort: Pulmonary effort is normal.     Breath sounds: Normal breath sounds.  Abdominal:     General: Abdomen is flat.     Palpations: Abdomen is soft.  Musculoskeletal:         General: No swelling. Normal range of motion.     Cervical back: Normal range of motion and neck supple.  Skin:    General: Skin is warm and dry.  Neurological:     General: No focal deficit present.     Mental Status: She is alert and oriented to person, place, and time.  Psychiatric:        Attention and Perception: Attention and perception normal.        Mood and Affect: Mood is anxious.        Speech: Speech is rapid and pressured.        Behavior: Behavior is agitated.        Thought Content: Thought content includes suicidal ideation.        Cognition and Memory: Cognition is impaired.        Judgment: Judgment is impulsive.     Review of Systems  Constitutional: Positive for activity change. Negative for fatigue.  HENT: Negative for rhinorrhea and sore throat.   Eyes: Negative for photophobia and visual disturbance.  Respiratory: Negative for cough and shortness of breath.   Cardiovascular: Negative for chest pain and palpitations.  Gastrointestinal: Negative for constipation, diarrhea, nausea and vomiting.  Endocrine: Negative for cold intolerance and heat intolerance.  Genitourinary: Negative for difficulty urinating and dysuria.  Musculoskeletal: Positive for arthralgias and myalgias.  Skin: Negative for rash and wound.  Allergic/Immunologic: Negative for environmental allergies and food allergies.  Neurological: Negative for dizziness and headaches.  Hematological: Negative for adenopathy. Does not bruise/bleed easily.  Psychiatric/Behavioral: Positive for agitation, dysphoric mood, sleep disturbance and suicidal ideas. The patient is nervous/anxious.     Blood pressure 120/89, pulse (!) 118, temperature 98.6 F (37 C), temperature source Oral, resp. rate 19, height 5\' 7"  (1.702 m), weight 83.9 kg, SpO2 98 %.Body mass index is 28.97 kg/m.  General Appearance: Disheveled  Eye Contact:  Good  Speech:  Pressured  Volume:  Normal  Mood:  Anxious and Irritable   Affect:  Labile and Tearful  Thought Process:  Disorganized  Orientation:  Full (Time, Place, and Person)  Thought Content:  Tangential  Suicidal Thoughts:  Yes.  without intent/plan  Homicidal Thoughts:  No  Memory:  Immediate;   Fair Recent;   Fair Remote;   Fair  Judgement:  Impaired  Insight:  Present  Psychomotor Activity:  Increased  Concentration:  Concentration: Poor and Attention Span: Poor  Recall:  of Knowledge:  Fair  Language:  Fair  Akathisia:  Negative  Handed:  Right  AIMS (if indicated):     Assets:  Communication Skills Desire for Improvement Financial Resources/Insurance Housing Intimacy Resilience Social Support  ADL's:  Intact  Cognition:  Impaired,  Mild  Sleep:  Number of Hours: 6.5      COGNITIVE FEATURES THAT CONTRIBUTE TO RISK:  Polarized thinking    SUICIDE RISK:   Moderate:  Frequent suicidal ideation with limited intensity, and duration, some specificity in terms of plans, no associated intent, good self-control, limited dysphoria/symptomatology, some risk factors present, and identifiable protective factors, including available and accessible social support.  PLAN OF CARE: Continue  inpatient admission. Stop Effexor and Remeron at this time. Start Seroquel 25 mg daily and 100 mg QHS for bipolar II disorder. Resume home dose of Suboxone 8-2 mg tabs TID sublingually for opiate use disorder. Metformin 1000 mg BID, Lantus 25 mg nightly, Novolog 8 TID with meals, sliding scale novolog, and diabetes coordinator consult for glucose manangement. Resume home dose gabapentin 800 mg four times daily for neuropathic pain.   I certify that inpatient services furnished can reasonably be expected to improve the patient's condition.   Jesse Sans, MD 09/08/2020, 11:01 AM

## 2020-09-08 NOTE — Progress Notes (Signed)
Patient calm and cooperative during assessment denying SI/HI/AVH. Patient endorses anxiety. Patient stated she had a good day and is hopeful she will leave soon. Patient given education, support, and encouragement to be active in her treatment plan. Patient compliant with medication administration per MD orders. Pt observed interacting appropriately with staff and peers on the unit. Patient being monitored Q 15 minutes for safety per unit protocol. Pt remains safe on the unit.  

## 2020-09-08 NOTE — Plan of Care (Signed)
Patient was little irritable this morning.Patient stated that she is upset with her mother for not bringing her personal care items.Patient is hyper verbal and disorganized in her speech.Patient stated that she had lot of  " trauma " in her childhood.Patient denies SI,HI and AVH at this time.Visible in the milieu.Attended groups.Compliant with medications.Appetite and energy level good.Support and encouragement given.

## 2020-09-08 NOTE — Plan of Care (Signed)
Patient stated she was feeling better today compared to when she came in last night  Problem: Education: Goal: Emotional status will improve Outcome: Progressing Goal: Mental status will improve Outcome: Progressing

## 2020-09-08 NOTE — H&P (Signed)
Psychiatric Admission Assessment Adult  Patient Identification: Stephanie Barry MRN:  [id number] Date of Evaluation:  09/08/2020 Chief Complaint:  MDD (major depressive disorder), recurrent episode, severe (HCC) [F33.2] Principal Diagnosis: Bipolar II disorder, severe, depressed, with anxious distress (HCC) Diagnosis:  Principal Problem:   Bipolar II disorder, severe, depressed, with anxious distress (HCC) Active Problems:   Hypothyroidism   Cannabis use disorder, moderate, dependence (HCC)   Diabetes (HCC)   Opioid use disorder, severe, in early remission (HCC)   Cocaine abuse (HCC)  History of Present Illness:  39 year old female who presents after intentional ingestion of over 100 tablets of benadryl as suicide attempt in context of worsening mood and inability to sleep. Patient seen and assessed one-on-one at beside this morning. Her speech is pressured, and her thoughts are fairly tangential. However, she is able to be redirected to gather the following history. She notes that at times she will go 2-4 days without sleeping, feeling wide awake, and becoming severely irritable. She has been told in the past she has bipolar disorder, and believes that this is likely accurate. Her cousin and mother both suffer from bipolar disorder. In a previous hospitalization she was placed on Seroquel which she did feel helped with anxiety and sleep. She is agreeable to stopping Effexor and Mirtazapine to start Seroquel in its place. She notes that her mood is "all over the place" today. She has gone from good mood, to anxious, to tearful, to feeling that she wants to "beating someone to death in the face." She becomes tearful during exam stating that she wants her mood to be stable, and back to a normal sleep schedule so that she can care for her 5 children. She notes that she often takes her moods out on her husband of 15-years, but knows he does not deserve that treatment.   Associated  Signs/Symptoms: Depression Symptoms:  depressed mood, insomnia, difficulty concentrating, anxiety, Duration of Depression Symptoms: No data recorded (Hypo) Manic Symptoms:  Distractibility, Elevated Mood, Flight of Ideas, Impulsivity, Irritable Mood, Labiality of Mood, Anxiety Symptoms:  Excessive Worry, Panic Symptoms, Psychotic Symptoms:  Denies Duration of Psychotic Symptoms: No data recorded PTSD Symptoms: Negative Total Time spent with patient: 1 hour  Past Psychiatric History:  H/o depression, mainly opiod use disorder on suboxone maintenance, cannabis abuse  Hospitalizations 2-3 times in the past, last about 8 months ago, has hx of rehab for susbtance use. Hx of OD on pill about 4 yrs ago.  OP provider- [employer], [place], Whitney Muse, NP  Is the patient at risk to self? Yes.    Has the patient been a risk to self in the past 6 months? Yes.    Has the patient been a risk to self within the distant past? Yes.    Is the patient a risk to others? No.  Has the patient been a risk to others in the past 6 months? No.  Has the patient been a risk to others within the distant past? No.   Prior Inpatient Therapy:   Prior Outpatient Therapy:    Alcohol Screening: 1. How often do you have a drink containing alcohol?: Never 2. How many drinks containing alcohol do you have on a typical day when you are drinking?: 1 or 2 3. How often do you have six or more drinks on one occasion?: Never AUDIT-C Score: 0 4. How often during the last year have you found that you were not able to stop drinking once you had started?: Never 5.  How often during the last year have you failed to do what was normally expected from you because of drinking?: Never 6. How often during the last year have you needed a first drink in the morning to get yourself going after a heavy drinking session?: Never 7. How often during the last year have you had a feeling of guilt of remorse after drinking?:  Never 8. How often during the last year have you been unable to remember what happened the night before because you had been drinking?: Never 9. Have you or someone else been injured as a result of your drinking?: No 10. Has a relative or friend or a doctor or another health worker been concerned about your drinking or suggested you cut down?: No Alcohol Use Disorder Identification Test Final Score (AUDIT): 0 Alcohol Brief Interventions/Follow-up: AUDIT Score <7 follow-up not indicated Substance Abuse History in the last 12 months:  Yes.   Consequences of Substance Abuse: Withdrawal Symptoms:   Diarrhea Headaches Nausea Tremors Previous Psychotropic Medications: Yes  Psychological Evaluations: Yes  Past Medical History:  Past Medical History:  Diagnosis Date  . Diabetes mellitus without complication (HCC)   . Hepatitis C   . Thyroid disease     Past Surgical History:  Procedure Laterality Date  . TUBAL LIGATION     Family History:  Family History  Problem Relation Age of Onset  . Diabetes Mellitus II Mother   . Diabetes Mellitus II Father    Family Psychiatric  History: Cousin and mother with bipolar disorder, cousin with prior suicide attempts Tobacco Screening: Have you used any form of tobacco in the last 30 days? (Cigarettes, Smokeless Tobacco, Cigars, and/or Pipes): Yes Tobacco use, Select all that apply: 5 or more cigarettes per day Are you interested in Tobacco Cessation Medications?: No, patient refused Counseled patient on smoking cessation including recognizing danger situations, developing coping skills and basic information about quitting provided: Refused/Declined practical counseling Social History:  Social History   Substance and Sexual Activity  Alcohol Use No     Social History   Substance and Sexual Activity  Drug Use Yes  . Types: Marijuana, Cocaine    Additional Social History: Marital status: Married Number of Years Married: 5 What types of  issues is patient dealing with in the relationship?: "my family" Does patient have children?: Yes How many children?: 5 How is patient's relationship with their children?: "they love me and I love them"                         Allergies:  No Known Allergies Lab Results:  Results for orders placed or performed during the hospital encounter of 09/07/20 (from the past 48 hour(s))  Glucose, capillary     Status: Abnormal   Collection Time: 09/08/20  7:51 AM  Result Value Ref Range   Glucose-Capillary 315 (H) 70 - 99 mg/dL    Comment: Glucose reference range applies only to samples taken after fasting for at least 8 hours.  Glucose, capillary     Status: Abnormal   Collection Time: 09/08/20 11:37 AM  Result Value Ref Range   Glucose-Capillary 400 (H) 70 - 99 mg/dL    Comment: Glucose reference range applies only to samples taken after fasting for at least 8 hours.    Blood Alcohol level:  Lab Results  Component Value Date   [facility] <10 09/05/2020   ETH <10 06/13/2020    Metabolic Disorder Labs:  Lab Results  Component Value Date   HGBA1C 11.8 (H) 04/07/2019   MPG 291.96 04/07/2019   No results found for: PROLACTIN Lab Results  Component Value Date   CHOL 201 (H) 04/07/2019   TRIG 466 (H) 04/07/2019   HDL 23 (L) 04/07/2019   CHOLHDL 8.7 04/07/2019   VLDL UNABLE TO CALCULATE IF TRIGLYCERIDE OVER 400 mg/dL 16/10/960406/05/2019   LDLCALC UNABLE TO CALCULATE IF TRIGLYCERIDE OVER 400 mg/dL 54/09/811906/05/2019    Current Medications: Current Facility-Administered Medications  Medication Dose Route Frequency Provider Last Rate Last Admin  . acetaminophen (TYLENOL) tablet 650 mg  650 mg Oral Q6H PRN Nwoko, Uchenna E, PA      . alum & mag hydroxide-simeth (MAALOX/MYLANTA) 200-200-20 MG/5ML suspension 30 mL  30 mL Oral Q4H PRN Nwoko, Uchenna E, PA      . buprenorphine-naloxone (SUBOXONE) 8-2 mg per SL tablet 1 tablet  1 tablet Sublingual TID Jesse SansFreeman, Lynniah Janoski M, MD   1 tablet at 09/08/20 1142  .  gabapentin (NEURONTIN) capsule 800 mg  800 mg Oral QID Nwoko, Uchenna E, PA   800 mg at 09/08/20 1142  . hydrOXYzine (ATARAX/VISTARIL) tablet 25 mg  25 mg Oral TID PRN Nwoko, Uchenna E, PA   25 mg at 09/07/20 2343  . insulin aspart (novoLOG) injection 0-15 Units  0-15 Units Subcutaneous TID WC Jesse SansFreeman, Arbutus Nelligan M, MD   15 Units at 09/08/20 1141  . insulin aspart (novoLOG) injection 8 Units  8 Units Subcutaneous TID WC Jesse SansFreeman, Earle Troiano M, MD   8 Units at 09/08/20 1141  . insulin glargine (LANTUS) injection 25 Units  25 Units Subcutaneous QHS Jesse SansFreeman, Destry Bezdek M, MD      . magnesium hydroxide (MILK OF MAGNESIA) suspension 30 mL  30 mL Oral Daily PRN Nwoko, Uchenna E, PA      . metFORMIN (GLUCOPHAGE) tablet 1,000 mg  1,000 mg Oral BID Nwoko, Uchenna E, PA   1,000 mg at 09/08/20 0752  . QUEtiapine (SEROQUEL XR) 24 hr tablet 100 mg  100 mg Oral QHS Jesse SansFreeman, Irelyn Perfecto M, MD      . QUEtiapine (SEROQUEL) tablet 25 mg  25 mg Oral QHS Jesse SansFreeman, Aideliz Garmany M, MD      . traZODone (DESYREL) tablet 50 mg  50 mg Oral QHS PRN Nwoko, Uchenna E, PA   50 mg at 09/07/20 2343   PTA Medications: Medications Prior to Admission  Medication Sig Dispense Refill Last Dose  . Buprenorphine HCl-Naloxone HCl 8-2 MG FILM Place under the tongue 3 (three) times daily.     . cetirizine (ZYRTEC) 10 MG tablet Take 10 mg by mouth daily.     Marland Kitchen. gabapentin (NEURONTIN) 800 MG tablet Take 800 mg by mouth 4 (four) times daily.     . insulin glargine (LANTUS) 100 UNIT/ML injection Inject 20 Units into the skin daily.     Marland Kitchen. JARDIANCE 25 MG TABS tablet Take 25 mg by mouth daily.     Marland Kitchen. levothyroxine (SYNTHROID) 100 MCG tablet Take 100 mcg by mouth daily.     Marland Kitchen. lisinopril (ZESTRIL) 2.5 MG tablet Take 2.5 mg by mouth daily.     . metFORMIN (GLUCOPHAGE) 1000 MG tablet Take 1,000 mg by mouth 2 (two) times daily.     . mirtazapine (REMERON SOL-TAB) 15 MG disintegrating tablet Take 15 mg by mouth at bedtime.     . mirtazapine (REMERON SOL-TAB) 30 MG  disintegrating tablet Take 30 mg by mouth at bedtime. (Patient not taking: Reported on 09/05/2020)     .  naloxone (NARCAN) 0.4 MG/ML injection Inject into the muscle.     Marland Kitchen omeprazole (PRILOSEC) 20 MG capsule Take 20 mg by mouth daily.     Marland Kitchen venlafaxine XR (EFFEXOR-XR) 75 MG 24 hr capsule Take 225 mg by mouth daily.     Marland Kitchen VICTOZA 18 MG/3ML SOPN Inject 1.2 mg into the skin daily.       Musculoskeletal: Strength & Muscle Tone: within normal limits Gait & Station: normal Patient leans: N/A  Psychiatric Specialty Exam: Physical Exam Vitals and nursing note reviewed.  Constitutional:      General: She is in acute distress.     Appearance: Normal appearance.  HENT:     Head: Normocephalic and atraumatic.     Right Ear: External ear normal.     Left Ear: External ear normal.     Nose: Nose normal.     Mouth/Throat:     Mouth: Mucous membranes are moist.     Pharynx: Oropharynx is clear.  Eyes:     Extraocular Movements: Extraocular movements intact.     Conjunctiva/sclera: Conjunctivae normal.     Pupils: Pupils are equal, round, and reactive to light.  Cardiovascular:     Rate and Rhythm: Tachycardia present.     Pulses: Normal pulses.  Pulmonary:     Effort: Pulmonary effort is normal.     Breath sounds: Normal breath sounds.  Abdominal:     General: Abdomen is flat.     Palpations: Abdomen is soft.  Musculoskeletal:        General: No swelling. Normal range of motion.     Cervical back: Normal range of motion and neck supple.  Skin:    General: Skin is warm and dry.  Neurological:     General: No focal deficit present.     Mental Status: She is alert and oriented to person, place, and time.  Psychiatric:        Attention and Perception: Attention and perception normal.        Mood and Affect: Mood is anxious.        Speech: Speech is rapid and pressured.        Behavior: Behavior is agitated.        Thought Content: Thought content includes suicidal ideation.         Cognition and Memory: Cognition is impaired.        Judgment: Judgment is impulsive.     Review of Systems  Constitutional: Positive for activity change. Negative for fatigue.  HENT: Negative for rhinorrhea and sore throat.   Eyes: Negative for photophobia and visual disturbance.  Respiratory: Negative for cough and shortness of breath.   Cardiovascular: Negative for chest pain and palpitations.  Gastrointestinal: Negative for constipation, diarrhea, nausea and vomiting.  Endocrine: Negative for cold intolerance and heat intolerance.  Genitourinary: Negative for difficulty urinating and dysuria.  Musculoskeletal: Positive for arthralgias and myalgias.  Skin: Negative for rash and wound.  Allergic/Immunologic: Negative for environmental allergies and food allergies.  Neurological: Negative for dizziness and headaches.  Hematological: Negative for adenopathy. Does not bruise/bleed easily.  Psychiatric/Behavioral: Positive for agitation, dysphoric mood, sleep disturbance and suicidal ideas. The patient is nervous/anxious.     Blood pressure 120/89, pulse (!) 118, temperature 98.6 F (37 C), temperature source Oral, resp. rate 19, height 5\' 7"  (1.702 m), weight 83.9 kg, SpO2 98 %.Body mass index is 28.97 kg/m.  General Appearance: Disheveled  Eye Contact:  Good  Speech:  Pressured  Volume:  Normal  Mood:  Anxious and Irritable  Affect:  Labile and Tearful  Thought Process:  Disorganized  Orientation:  Full (Time, Place, and Person)  Thought Content:  Tangential  Suicidal Thoughts:  Yes.  without intent/plan  Homicidal Thoughts:  No  Memory:  Immediate;   Fair Recent;   Fair Remote;   Fair  Judgement:  Impaired  Insight:  Present  Psychomotor Activity:  Increased  Concentration:  Concentration: Poor and Attention Span: Poor  Recall:  [employer] of Knowledge:  Fair  Language:  Fair  Akathisia:  Negative  Handed:  Right  AIMS (if indicated):     Assets:  Communication  Skills Desire for Improvement Financial Resources/Insurance Housing Intimacy Resilience Social Support  ADL's:  Intact  Cognition:  Impaired,  Mild  Sleep:  Number of Hours: 6.5       Treatment Plan Summary: Daily contact with patient to assess and evaluate symptoms and progress in treatment and Medication management PLAN OF CARE: Continue inpatient admission. Stop Effexor and Remeron at this time. Start Seroquel 25 mg daily and 100 mg QHS for bipolar II disorder. Resume home dose of Suboxone 8-2 mg tabs TID sublingually for opiate use disorder. Metformin 1000 mg BID, Lantus 25 mg nightly, Novolog 8 TID with meals, sliding scale novolog, and diabetes coordinator consult for glucose manangement. Resume home dose gabapentin 800 mg four times daily for neuropathic pain.   Observation Level/Precautions:  15 minute checks  Laboratory:  lipid panel and hemoglobin a1c  Psychotherapy:    Medications:    Consultations:    Discharge Concerns:    Estimated LOS:  Other:     Physician Treatment Plan for Primary Diagnosis: Bipolar II disorder, severe, depressed, with anxious distress (HCC) Long Term Goal(s): Improvement in symptoms so as ready for discharge  Short Term Goals: Ability to identify changes in lifestyle to reduce recurrence of condition will improve, Ability to verbalize feelings will improve, Ability to disclose and discuss suicidal ideas, Ability to demonstrate self-control will improve, Ability to identify and develop effective coping behaviors will improve, Compliance with prescribed medications will improve and Ability to identify triggers associated with substance abuse/mental health issues will improve  Physician Treatment Plan for Secondary Diagnosis: Principal Problem:   Bipolar II disorder, severe, depressed, with anxious distress (HCC) Active Problems:   Hypothyroidism   Cannabis use disorder, moderate, dependence (HCC)   Diabetes (HCC)   Opioid use disorder, severe, in  early remission (HCC)   Cocaine abuse (HCC)  Long Term Goal(s): Improvement in symptoms so as ready for discharge  Short Term Goals: Ability to identify changes in lifestyle to reduce recurrence of condition will improve, Ability to verbalize feelings will improve, Ability to disclose and discuss suicidal ideas, Ability to demonstrate self-control will improve, Ability to identify and develop effective coping behaviors will improve, Compliance with prescribed medications will improve and Ability to identify triggers associated with substance abuse/mental health issues will improve  I certify that inpatient services furnished can reasonably be expected to improve the patient's condition.    Jesse Sans, MD 11/9/202112:06 PM

## 2020-09-08 NOTE — Progress Notes (Signed)
Recreation Therapy Notes   Date: 09/08/2020  Time: 9:30 am   Location: Craft room     Behavioral response: N/A   Intervention Topic: Relaxation   Discussion/Intervention: Patient did not attend group.   Clinical Observations/Feedback:  Patient did not attend group.   Bo Teicher LRT/CTRS        Yoandri Congrove 09/08/2020 11:03 AM

## 2020-09-08 NOTE — Progress Notes (Signed)
Inpatient Diabetes Program Recommendations  AACE/ADA: New Consensus Statement on Inpatient Glycemic Control (2015)  Target Ranges:  Prepandial:   less than 140 mg/dL      Peak postprandial:   less than 180 mg/dL (1-2 hours)      Critically ill patients:  140 - 180 mg/dL   Lab Results  Component Value Date   GLUCAP 315 (H) 09/08/2020   HGBA1C 11.8 (H) 04/07/2019    Review of Glycemic Control Results for Stephanie Barry, Stephanie Barry (MRN [id number]) as of 09/08/2020 10:23  Ref. Range 09/07/2020 08:30 09/07/2020 12:39 09/07/2020 18:01 09/07/2020 21:00 09/08/2020 07:51  Glucose-Capillary Latest Ref Range: 70 - 99 mg/dL 224 (H) 497 (H) 530 (H) 290 (H) 315 (H)   Diabetes history: DM2 Outpatient Diabetes medications: Lantus 20 units + Jardiance 25 mg qd + Victoza 1.2 qd Current orders for Inpatient glycemic control: Lantus 20 units + Novolog 8 units tid meal coverage + Metformin 1 gm bid + Novolog correction moderate tid  Inpatient Diabetes Program Recommendations:   Consider increase in Lantus to 25 units -A1c to determine pre admission glycemic control Secure chat sent to Dr. Neale Burly  Thank you, Billy Fischer. Jamilee Lafosse, RN, MSN, CDE  Diabetes Coordinator Inpatient Glycemic Control Team Team Pager [phone] (8am-5pm) 09/08/2020 10:33 AM

## 2020-09-09 LAB — GLUCOSE, CAPILLARY
Glucose-Capillary: 221 mg/dL — ABNORMAL HIGH (ref 70–99)
Glucose-Capillary: 356 mg/dL — ABNORMAL HIGH (ref 70–99)
Glucose-Capillary: 254 mg/dL — ABNORMAL HIGH (ref 70–99)
Glucose-Capillary: 228 mg/dL — ABNORMAL HIGH (ref 70–99)

## 2020-09-09 LAB — HEMOGLOBIN A1C
Hgb A1c MFr Bld: 6.4 % — ABNORMAL HIGH (ref 4.8–5.6)
Mean Plasma Glucose: 136.98 mg/dL

## 2020-09-09 LAB — LIPID PANEL
Cholesterol: 226 mg/dL — ABNORMAL HIGH (ref 0–200)
HDL: 42 mg/dL (ref >40)
LDL Cholesterol: UNDETERMINED mg/dL (ref 0–99)
Total CHOL/HDL Ratio: 5.4 RATIO
Triglycerides: 516 mg/dL — ABNORMAL HIGH (ref <150)
VLDL: UNDETERMINED mg/dL (ref 0–40)

## 2020-09-09 MED ORDER — QUETIAPINE FUMARATE 25 MG PO TABS
50.0000 mg | ORAL_TABLET | Freq: Every day | ORAL | Status: DC
  Administered 2020-09-09 – 2020-09-11 (×3): 50 mg via ORAL
  Filled 2020-09-09 (×3): qty 2

## 2020-09-09 MED ORDER — QUETIAPINE FUMARATE ER 200 MG PO TB24
200.0000 mg | ORAL_TABLET | Freq: Every day | ORAL | Status: DC
  Administered 2020-09-09: 200 mg via ORAL
  Filled 2020-09-09 (×2): qty 1

## 2020-09-09 MED ORDER — PRAZOSIN HCL 1 MG PO CAPS
1.0000 mg | ORAL_CAPSULE | Freq: Every day | ORAL | Status: DC
  Administered 2020-09-09 – 2020-09-10 (×2): 1 mg via ORAL
  Filled 2020-09-09 (×2): qty 1

## 2020-09-09 MED ORDER — INSULIN ASPART 100 UNIT/ML ~~LOC~~ SOLN
12.0000 [IU] | Freq: Three times a day (TID) | SUBCUTANEOUS | Status: DC
  Administered 2020-09-09 – 2020-09-10 (×2): 12 [IU] via SUBCUTANEOUS
  Filled 2020-09-09 (×2): qty 1

## 2020-09-09 NOTE — Progress Notes (Signed)
[facility] MD Progress Note  09/09/2020 10:56 AM Stephanie Barry  MRN:  [id number]   Subjective:  39 year old female who presents after intentional ingestion of over 100 tablets of benadryl as suicide attempt in context of worsening mood and inability to sleep. Patient seen during treatment team and again one-on-one at bedside. Her speech remains pressured, tangential, and her affect remains very labile. She alternates from elated mood to irritable mood to tearful on exam. Thought process is extremely difficult to follow secondary to flight of ideas. She speaks about her husband and their relationship. Her relationship with her mother which appears strained. Her desire to move back to [place] where she felt her mental health treatment was better. She also discusses night terrors, and vivid nightmares. Discussed the plan to increase Seroquel to 50 mg daily, and 200 mg nightly for mood stabilization. Will also add prazosin 1 mg nightly for nightmares.   Principal Problem: Bipolar II disorder, severe, depressed, with anxious distress (HCC) Diagnosis: Principal Problem:   Bipolar II disorder, severe, depressed, with anxious distress (HCC) Active Problems:   Hypothyroidism   Cannabis use disorder, moderate, dependence (HCC)   Diabetes (HCC)   Opioid use disorder, severe, in early remission (HCC)   Cocaine abuse (HCC)  Total Time spent with patient: 45 minutes  Past Psychiatric History:  H/o depression, mainly opiod use disorder on suboxone maintenance, cannabis abuse  Hospitalizations 2-3 times in the past, last about 8 months ago, has hx of rehab for susbtance use. Hx of OD on pill about 4 yrs ago.  OP provider- [place] [facility], [place], Whitney Muse, NP  Past Medical History:  Past Medical History:  Diagnosis Date   Diabetes mellitus without complication (HCC)    Hepatitis C    Thyroid disease     Past Surgical History:  Procedure Laterality Date   TUBAL LIGATION     Family History:   Family History  Problem Relation Age of Onset   Diabetes Mellitus II Mother    Diabetes Mellitus II Father    Family Psychiatric  History: Cousin and mother with bipolar disorder, cousin with prior suicide attempts Social History:  Social History   Substance and Sexual Activity  Alcohol Use No     Social History   Substance and Sexual Activity  Drug Use Yes   Types: Marijuana, Cocaine    Social History   Socioeconomic History   Marital status: Married    Spouse name: Peyton Najjar    Number of children: 5   Years of education: Not on file   Highest education level: Not on file  Occupational History   Not on file  Tobacco Use   Smoking status: Former Smoker   Smokeless tobacco: Never Used  Building services engineer Use: Never used  Substance and Sexual Activity   Alcohol use: No   Drug use: Yes    Types: Marijuana, Cocaine   Sexual activity: Yes  Other Topics Concern   Not on file  Social History Narrative   Not on file   Social Determinants of Health   Financial Resource Strain:    Difficulty of Paying Living Expenses: Not on file  Food Insecurity:    Worried About Programme researcher, broadcasting/film/video in the Last Year: Not on file   [employer] of Food in the Last Year: Not on file  Transportation Needs:    Lack of Transportation (Medical): Not on file   Lack of Transportation (Non-Medical): Not on file  Physical Activity:  Days of Exercise per Week: Not on file   Minutes of Exercise per Session: Not on file  Stress:    Feeling of Stress : Not on file  Social Connections:    Frequency of Communication with Friends and Family: Not on file   Frequency of Social Gatherings with Friends and Family: Not on file   Attends Religious Services: Not on file   Active Member of Clubs or Organizations: Not on file   Attends Banker Meetings: Not on file   Marital Status: Not on file   Additional Social History:                         Sleep: Fair  Appetite:   Fair  Current Medications: Current Facility-Administered Medications  Medication Dose Route Frequency Provider Last Rate Last Admin   acetaminophen (TYLENOL) tablet 650 mg  650 mg Oral Q6H PRN Nwoko, Uchenna E, PA       alum & mag hydroxide-simeth (MAALOX/MYLANTA) 200-200-20 MG/5ML suspension 30 mL  30 mL Oral Q4H PRN Nwoko, Uchenna E, PA   30 mL at 09/08/20 1939   buprenorphine-naloxone (SUBOXONE) 8-2 mg per SL tablet 1 tablet  1 tablet Sublingual TID Jesse Sans, MD   1 tablet at 09/09/20 0805   gabapentin (NEURONTIN) capsule 800 mg  800 mg Oral QID Nwoko, Uchenna E, PA   800 mg at 09/09/20 0805   hydrOXYzine (ATARAX/VISTARIL) tablet 25 mg  25 mg Oral TID PRN Nwoko, Uchenna E, PA   25 mg at 09/08/20 1939   insulin aspart (novoLOG) injection 0-15 Units  0-15 Units Subcutaneous TID WC Jesse Sans, MD   5 Units at 09/09/20 0804   insulin aspart (novoLOG) injection 8 Units  8 Units Subcutaneous TID WC Jesse Sans, MD   8 Units at 09/09/20 0805   insulin glargine (LANTUS) injection 25 Units  25 Units Subcutaneous QHS Jesse Sans, MD   25 Units at 09/08/20 2116   magnesium hydroxide (MILK OF MAGNESIA) suspension 30 mL  30 mL Oral Daily PRN Nwoko, Uchenna E, PA       metFORMIN (GLUCOPHAGE) tablet 1,000 mg  1,000 mg Oral BID Nwoko, Uchenna E, PA   1,000 mg at 09/09/20 0805   prazosin (MINIPRESS) capsule 1 mg  1 mg Oral QHS Jesse Sans, MD       QUEtiapine (SEROQUEL XR) 24 hr tablet 200 mg  200 mg Oral QHS Jesse Sans, MD       QUEtiapine (SEROQUEL) tablet 50 mg  50 mg Oral Daily Jesse Sans, MD       traZODone (DESYREL) tablet 50 mg  50 mg Oral QHS PRN Nwoko, Uchenna E, PA   50 mg at 09/08/20 2345    Lab Results:  Results for orders placed or performed during the hospital encounter of 09/07/20 (from the past 48 hour(s))  Glucose, capillary     Status: Abnormal   Collection Time: 09/08/20  7:51 AM  Result Value Ref Range   Glucose-Capillary 315 (H) 70 - 99 mg/dL     Comment: Glucose reference range applies only to samples taken after fasting for at least 8 hours.  Glucose, capillary     Status: Abnormal   Collection Time: 09/08/20 11:37 AM  Result Value Ref Range   Glucose-Capillary 400 (H) 70 - 99 mg/dL    Comment: Glucose reference range applies only to samples taken after fasting for at least  8 hours.  Glucose, capillary     Status: Abnormal   Collection Time: 09/08/20  4:10 PM  Result Value Ref Range   Glucose-Capillary 299 (H) 70 - 99 mg/dL    Comment: Glucose reference range applies only to samples taken after fasting for at least 8 hours.  Glucose, capillary     Status: Abnormal   Collection Time: 09/08/20  8:17 PM  Result Value Ref Range   Glucose-Capillary 190 (H) 70 - 99 mg/dL    Comment: Glucose reference range applies only to samples taken after fasting for at least 8 hours.   Comment 1 Notify RN   Glucose, capillary     Status: Abnormal   Collection Time: 09/09/20  7:13 AM  Result Value Ref Range   Glucose-Capillary 221 (H) 70 - 99 mg/dL    Comment: Glucose reference range applies only to samples taken after fasting for at least 8 hours.   Comment 1 Notify RN   Lipid panel     Status: Abnormal   Collection Time: 09/09/20  7:26 AM  Result Value Ref Range   Cholesterol 226 (H) 0 - 200 mg/dL   Triglycerides 732 (H) <150 mg/dL   HDL 42 >20 mg/dL   Total CHOL/HDL Ratio 5.4 RATIO   VLDL UNABLE TO CALCULATE IF TRIGLYCERIDE OVER 400 mg/dL 0 - 40 mg/dL   LDL Cholesterol UNABLE TO CALCULATE IF TRIGLYCERIDE OVER 400 mg/dL 0 - 99 mg/dL    Comment:        Total Cholesterol/HDL:CHD Risk Coronary Heart Disease Risk Table                     Men   Women  1/2 Average Risk   3.4   3.3  Average Risk       5.0   4.4  2 X Average Risk   9.6   7.1  3 X Average Risk  23.4   11.0        Use the calculated Patient Ratio above and the CHD Risk Table to determine the patient's CHD Risk.        ATP III CLASSIFICATION (LDL):  <100     mg/dL    Optimal  254-270  mg/dL   Near or Above                    Optimal  130-159  mg/dL   Borderline  623-762  mg/dL   High  >831     mg/dL   Very High Performed at [facility], [address] [place], [place] [address]     Blood Alcohol level:  Lab Results  Component Value Date   [facility] <10 09/05/2020   ETH <10 06/13/2020    Metabolic Disorder Labs: Lab Results  Component Value Date   HGBA1C 11.8 (H) 04/07/2019   MPG 291.96 04/07/2019   No results found for: PROLACTIN Lab Results  Component Value Date   CHOL 226 (H) 09/09/2020   TRIG 516 (H) 09/09/2020   HDL 42 09/09/2020   CHOLHDL 5.4 09/09/2020   VLDL UNABLE TO CALCULATE IF TRIGLYCERIDE OVER 400 mg/dL 60/73/7106   LDLCALC UNABLE TO CALCULATE IF TRIGLYCERIDE OVER 400 mg/dL 26/94/8546   LDLCALC UNABLE TO CALCULATE IF TRIGLYCERIDE OVER 400 mg/dL 27/12/5007    Physical Findings: AIMS:  , ,  ,  ,    CIWA:    COWS:     Musculoskeletal: Strength & Muscle Tone: within normal limits Gait &  Station: normal Patient leans: N/A  Psychiatric Specialty Exam: Physical Exam Vitals and nursing note reviewed.  Constitutional:      Appearance: Normal appearance.  HENT:     Head: Normocephalic and atraumatic.     Right Ear: External ear normal.     Left Ear: External ear normal.     Nose: Nose normal.     Mouth/Throat:     Mouth: Mucous membranes are moist.     Pharynx: Oropharynx is clear.  Eyes:     Extraocular Movements: Extraocular movements intact.     Conjunctiva/sclera: Conjunctivae normal.     Pupils: Pupils are equal, round, and reactive to light.  Cardiovascular:     Rate and Rhythm: Tachycardia present.     Pulses: Normal pulses.  Pulmonary:     Effort: Pulmonary effort is normal.     Breath sounds: Normal breath sounds.  Abdominal:     General: Abdomen is flat.     Palpations: Abdomen is soft.  Musculoskeletal:        General: No swelling. Normal range of motion.     Cervical back: Normal range  of motion and neck supple.  Skin:    General: Skin is warm and dry.  Neurological:     General: No focal deficit present.     Mental Status: She is alert and oriented to person, place, and time.  Psychiatric:        Attention and Perception: She is inattentive.        Mood and Affect: Affect is labile and tearful.        Speech: Speech is rapid and pressured and tangential.        Behavior: Behavior is hyperactive.        Thought Content: Thought content does not include homicidal or suicidal ideation.        Cognition and Memory: Memory normal. Cognition is impaired.        Judgment: Judgment is impulsive.     Review of Systems  Blood pressure 120/89, pulse (!) 118, temperature 98.6 F (37 C), temperature source Oral, resp. rate 19, height 5\' 7"  (1.702 m), weight 83.9 kg, SpO2 98 %.Body mass index is 28.97 kg/m.  General Appearance: Fairly Groomed  Eye Contact:  Good  Speech:  Pressured  Volume:  Normal  Mood:   Labile  Affect:  Congruent, Labile, and Tearful  Thought Process:  Disorganized  Orientation:  Full (Time, Place, and Person)  Thought Content:  Logical and Tangential  Suicidal Thoughts:  No  Homicidal Thoughts:  No  Memory:  Immediate;   Fair Recent;   Fair Remote;   Fair  Judgement:  Fair  Insight:  Fair  Psychomotor Activity:  Restlessness  Concentration:  Concentration: Fair and Attention Span: Fair  Recall:  [employer]  Fund of Knowledge:  Fair  Language:  Fair  Akathisia:  Negative  Handed:  Right  AIMS (if indicated):     Assets:  Communication Skills Desire for Improvement Financial Resources/Insurance Housing Social Support  ADL's:  Intact  Cognition:  WNL  Sleep:  Number of Hours: 4     Treatment Plan Summary: Daily contact with patient to assess and evaluate symptoms and progress in treatment and Medication management Plan Increase seroquel to 50 mg daily, and 200 mg nightly. Add prazosin 1 mg QHS for nightmares.   Jesse SansMegan M Hamsa Laurich,  MD 09/09/2020, 10:56 AM

## 2020-09-09 NOTE — Tx Team (Signed)
Interdisciplinary Treatment and Diagnostic Plan Update  09/09/2020 Time of Session: 9:00AM Libby Goehring MRN: [id number]  Principal Diagnosis: Bipolar II disorder, severe, depressed, with anxious distress (HCC)  Secondary Diagnoses: Principal Problem:   Bipolar II disorder, severe, depressed, with anxious distress (HCC) Active Problems:   Hypothyroidism   Cannabis use disorder, moderate, dependence (HCC)   Diabetes (HCC)   Opioid use disorder, severe, in early remission (HCC)   Cocaine abuse (HCC)   Current Medications:  Current Facility-Administered Medications  Medication Dose Route Frequency Provider Last Rate Last Admin  . acetaminophen (TYLENOL) tablet 650 mg  650 mg Oral Q6H PRN Nwoko, Uchenna E, PA      . alum & mag hydroxide-simeth (MAALOX/MYLANTA) 200-200-20 MG/5ML suspension 30 mL  30 mL Oral Q4H PRN Nwoko, Uchenna E, PA   30 mL at 09/08/20 1939  . buprenorphine-naloxone (SUBOXONE) 8-2 mg per SL tablet 1 tablet  1 tablet Sublingual TID Jesse Sans, MD   1 tablet at 09/09/20 0805  . gabapentin (NEURONTIN) capsule 800 mg  800 mg Oral QID Nwoko, Uchenna E, PA   800 mg at 09/09/20 0805  . hydrOXYzine (ATARAX/VISTARIL) tablet 25 mg  25 mg Oral TID PRN Nwoko, Uchenna E, PA   25 mg at 09/08/20 1939  . insulin aspart (novoLOG) injection 0-15 Units  0-15 Units Subcutaneous TID WC Jesse Sans, MD   5 Units at 09/09/20 0804  . insulin aspart (novoLOG) injection 8 Units  8 Units Subcutaneous TID WC Jesse Sans, MD   8 Units at 09/09/20 0805  . insulin glargine (LANTUS) injection 25 Units  25 Units Subcutaneous QHS Jesse Sans, MD   25 Units at 09/08/20 2116  . magnesium hydroxide (MILK OF MAGNESIA) suspension 30 mL  30 mL Oral Daily PRN Nwoko, Uchenna E, PA      . metFORMIN (GLUCOPHAGE) tablet 1,000 mg  1,000 mg Oral BID Nwoko, Uchenna E, PA   1,000 mg at 09/09/20 0805  . QUEtiapine (SEROQUEL XR) 24 hr tablet 200 mg  200 mg Oral QHS Jesse Sans, MD      .  QUEtiapine (SEROQUEL) tablet 50 mg  50 mg Oral Daily Jesse Sans, MD      . traZODone (DESYREL) tablet 50 mg  50 mg Oral QHS PRN Nwoko, Uchenna E, PA   50 mg at 09/08/20 2345   PTA Medications: Medications Prior to Admission  Medication Sig Dispense Refill Last Dose  . Buprenorphine HCl-Naloxone HCl 8-2 MG FILM Place under the tongue 3 (three) times daily.     . cetirizine (ZYRTEC) 10 MG tablet Take 10 mg by mouth daily.     Marland Kitchen gabapentin (NEURONTIN) 800 MG tablet Take 800 mg by mouth 4 (four) times daily.     . insulin glargine (LANTUS) 100 UNIT/ML injection Inject 20 Units into the skin daily.     Marland Kitchen JARDIANCE 25 MG TABS tablet Take 25 mg by mouth daily.     Marland Kitchen levothyroxine (SYNTHROID) 100 MCG tablet Take 100 mcg by mouth daily.     Marland Kitchen lisinopril (ZESTRIL) 2.5 MG tablet Take 2.5 mg by mouth daily.     . metFORMIN (GLUCOPHAGE) 1000 MG tablet Take 1,000 mg by mouth 2 (two) times daily.     . mirtazapine (REMERON SOL-TAB) 15 MG disintegrating tablet Take 15 mg by mouth at bedtime.     . mirtazapine (REMERON SOL-TAB) 30 MG disintegrating tablet Take 30 mg by mouth at bedtime. (Patient not taking: Reported  on 09/05/2020)     . naloxone (NARCAN) 0.4 MG/ML injection Inject into the muscle.     Marland Kitchen omeprazole (PRILOSEC) 20 MG capsule Take 20 mg by mouth daily.     Marland Kitchen venlafaxine XR (EFFEXOR-XR) 75 MG 24 hr capsule Take 225 mg by mouth daily.     Marland Kitchen VICTOZA 18 MG/3ML SOPN Inject 1.2 mg into the skin daily.       Patient Stressors: Medication change or noncompliance Substance abuse  Patient Strengths: Motivation for treatment/growth Supportive family/friends  Treatment Modalities: Medication Management, Group therapy, Case management,  1 to 1 session with clinician, Psychoeducation, Recreational therapy.   Physician Treatment Plan for Primary Diagnosis: Bipolar II disorder, severe, depressed, with anxious distress (HCC) Long Term Goal(s): Improvement in symptoms so as ready for  discharge Improvement in symptoms so as ready for discharge   Short Term Goals: Ability to identify changes in lifestyle to reduce recurrence of condition will improve Ability to verbalize feelings will improve Ability to disclose and discuss suicidal ideas Ability to demonstrate self-control will improve Ability to identify and develop effective coping behaviors will improve Compliance with prescribed medications will improve Ability to identify triggers associated with substance abuse/mental health issues will improve Ability to identify changes in lifestyle to reduce recurrence of condition will improve Ability to verbalize feelings will improve Ability to disclose and discuss suicidal ideas Ability to demonstrate self-control will improve Ability to identify and develop effective coping behaviors will improve Compliance with prescribed medications will improve Ability to identify triggers associated with substance abuse/mental health issues will improve  Medication Management: Evaluate patient's response, side effects, and tolerance of medication regimen.  Therapeutic Interventions: 1 to 1 sessions, Unit Group sessions and Medication administration.  Evaluation of Outcomes: Not Progressing  Physician Treatment Plan for Secondary Diagnosis: Principal Problem:   Bipolar II disorder, severe, depressed, with anxious distress (HCC) Active Problems:   Hypothyroidism   Cannabis use disorder, moderate, dependence (HCC)   Diabetes (HCC)   Opioid use disorder, severe, in early remission (HCC)   Cocaine abuse (HCC)  Long Term Goal(s): Improvement in symptoms so as ready for discharge Improvement in symptoms so as ready for discharge   Short Term Goals: Ability to identify changes in lifestyle to reduce recurrence of condition will improve Ability to verbalize feelings will improve Ability to disclose and discuss suicidal ideas Ability to demonstrate self-control will improve Ability  to identify and develop effective coping behaviors will improve Compliance with prescribed medications will improve Ability to identify triggers associated with substance abuse/mental health issues will improve Ability to identify changes in lifestyle to reduce recurrence of condition will improve Ability to verbalize feelings will improve Ability to disclose and discuss suicidal ideas Ability to demonstrate self-control will improve Ability to identify and develop effective coping behaviors will improve Compliance with prescribed medications will improve Ability to identify triggers associated with substance abuse/mental health issues will improve     Medication Management: Evaluate patient's response, side effects, and tolerance of medication regimen.  Therapeutic Interventions: 1 to 1 sessions, Unit Group sessions and Medication administration.  Evaluation of Outcomes: Not Progressing   RN Treatment Plan for Primary Diagnosis: Bipolar II disorder, severe, depressed, with anxious distress (HCC) Long Term Goal(s): Knowledge of disease and therapeutic regimen to maintain health will improve  Short Term Goals: Ability to demonstrate self-control, Ability to participate in decision making will improve, Ability to verbalize feelings will improve, Ability to identify and develop effective coping behaviors will improve and Compliance  with prescribed medications will improve  Medication Management: RN will administer medications as ordered by provider, will assess and evaluate patient's response and provide education to patient for prescribed medication. RN will report any adverse and/or side effects to prescribing provider.  Therapeutic Interventions: 1 on 1 counseling sessions, Psychoeducation, Medication administration, Evaluate responses to treatment, Monitor vital signs and CBGs as ordered, Perform/monitor CIWA, COWS, AIMS and Fall Risk screenings as ordered, Perform wound care treatments as  ordered.  Evaluation of Outcomes: Not Progressing   LCSW Treatment Plan for Primary Diagnosis: Bipolar II disorder, severe, depressed, with anxious distress (HCC) Long Term Goal(s): Safe transition to appropriate next level of care at discharge, Engage patient in therapeutic group addressing interpersonal concerns.  Short Term Goals: Engage patient in aftercare planning with referrals and resources, Increase social support, Increase ability to appropriately verbalize feelings, Increase emotional regulation, Facilitate acceptance of mental health diagnosis and concerns, Identify triggers associated with mental health/substance abuse issues and Increase skills for wellness and recovery  Therapeutic Interventions: Assess for all discharge needs, 1 to 1 time with Social worker, Explore available resources and support systems, Assess for adequacy in community support network, Educate family and significant other(s) on suicide prevention, Complete Psychosocial Assessment, Interpersonal group therapy.  Evaluation of Outcomes: Not Progressing   Progress in Treatment: Attending groups: No. Participating in groups: No. Taking medication as prescribed: Yes. Toleration medication: Yes. Family/Significant other contact made: Yes, individual(s) contacted:  SPE completed with husband, Domenic Moras. Patient understands diagnosis: Yes. Discussing patient identified problems/goals with staff: Yes. Medical problems stabilized or resolved: Yes. Denies suicidal/homicidal ideation: Yes. Issues/concerns per patient self-inventory: No. Other: None.  New problem(s) identified: No, Describe:  none.  New Short Term/Long Term Goal(s): medication management for mood stabilization; development of comprehensive mental wellness/sobriety plan.  Patient Goals: "Trying to go home but I want to get on medicine that helps."    Discharge Plan or Barriers: Pt plans to return home and is uncertain regarding aftercare. CSW  will assist with aftercare/discharge planning as necessary.  Reason for Continuation of Hospitalization: Anxiety Depression Medication stabilization  Estimated Length of Stay: 1-7 days  Attendees: Patient: Cedar Roseman 09/09/2020 10:15 AM  Physician: Les Pou, MD 09/09/2020 10:15 AM  Nursing: Cecille Amsterdam, RN 09/09/2020 10:15 AM  RN Care Manager: 09/09/2020 10:15 AM  Social Worker: Vilma Meckel. Algis Greenhouse, MSW, LCSW, LCAS 09/09/2020 10:15 AM  Recreational Therapist: Hilbert Bible, LRT  09/09/2020 10:15 AM  Other:  09/09/2020 10:15 AM  Other:  09/09/2020 10:15 AM  Other: 09/09/2020 10:15 AM    Scribe for Treatment Team: Glenis Smoker, LCSW 09/09/2020 10:15 AM

## 2020-09-09 NOTE — Progress Notes (Signed)
Recreation Therapy Notes  Date: 09/09/2020  Time: 9:30 am   Location: Craft room     Behavioral response: N/A   Intervention Topic: Leisure   Discussion/Intervention: Patient did not attend group.   Clinical Observations/Feedback:  Patient did not attend group.   Tirso Laws LRT/CTRS        Elani Delph 09/09/2020 12:41 PM

## 2020-09-09 NOTE — Progress Notes (Signed)
Recreation Therapy Notes  INPATIENT RECREATION THERAPY ASSESSMENT  Patient Details Name: Stephanie Barry MRN: [id number] DOB: 02/26/1981 Today's Date: 09/09/2020       Information Obtained From: Patient  Able to Participate in Assessment/Interview: Yes  Patient Presentation: Responsive  Reason for Admission (Per Patient): Active Symptoms, Med Non-Compliance  Patient Stressors:    Coping Skills:   Talk  Leisure Interests (2+):   (Time with my daughter)  Frequency of Recreation/Participation: Monthly  Awareness of Community Resources:     [employer]:     Current Use:    If no, Barriers?:    Expressed Interest in [employer] Information:    [place] of Residence:  Production manager  Patient Main Form of Transportation: Other (Comment) (Mother)  Patient Strengths:  N/A  Patient Identified Areas of Improvement:  N/A  Patient Goal for Hospitalization:  Get on medication and go home  Current SI (including self-harm):  No  Current HI:  No  Current AVH: No  Staff Intervention Plan: Group Attendance, Collaborate with Interdisciplinary Treatment Team  Consent to Intern Participation: N/A  Henry Utsey 09/09/2020, 3:55 PM

## 2020-09-09 NOTE — [facility] Group Notes (Signed)
LCSW Group Therapy Note  09/09/2020 2:09 PM  Type of Therapy/Topic:  Group Therapy:  Emotion Regulation  Participation Level:  Did Not Attend   Description of Group:   The purpose of this group is to assist patients in learning to regulate negative emotions and experience positive emotions. Patients will be guided to discuss ways in which they have been vulnerable to their negative emotions. These vulnerabilities will be juxtaposed with experiences of positive emotions or situations, and patients will be challenged to use positive emotions to combat negative ones. Special emphasis will be placed on coping with negative emotions in conflict situations, and patients will process healthy conflict resolution skills.  Therapeutic Goals: 1. Patient will identify two positive emotions or experiences to reflect on in order to balance out negative emotions 2. Patient will label two or more emotions that they find the most difficult to experience 3. Patient will demonstrate positive conflict resolution skills through discussion and/or role plays  Summary of Patient Progress: X  Therapeutic Modalities:   Cognitive Behavioral Therapy Feelings Identification Dialectical Behavioral Therapy  Dewayne Severe R. Tyquarius Paglia, MSW, LCSW, LCAS 09/09/2020 2:09 PM   

## 2020-09-09 NOTE — Progress Notes (Signed)
Inpatient Diabetes Program Recommendations  AACE/ADA: New Consensus Statement on Inpatient Glycemic Control (2015)  Target Ranges:  Prepandial:   less than 140 mg/dL      Peak postprandial:   less than 180 mg/dL (1-2 hours)      Critically ill patients:  140 - 180 mg/dL   Lab Results  Component Value Date   GLUCAP 356 (H) 09/09/2020   HGBA1C 6.4 (H) 09/09/2020    Review of Glycemic Control Results for ESTHELA, BRANDNER (MRN [id number]) as of 09/09/2020 14:43  Ref. Range 09/08/2020 11:37 09/08/2020 16:10 09/08/2020 20:17 09/09/2020 07:13 09/09/2020 11:46  Glucose-Capillary Latest Ref Range: 70 - 99 mg/dL 491 (H) 791 (H) 505 (H) 221 (H) 356 (H)   Inpatient Diabetes Program Recommendations:   -Postprandial CBGs remain elevated. Consider Increase Novolog meal coverage to 12 units tid if eats 50% Secure chat sent to Dr. Neale Burly.  Thank you, Billy Fischer. Rheya Minogue, RN, MSN, CDE  Diabetes Coordinator Inpatient Glycemic Control Team Team Pager [phone] (8am-5pm) 09/09/2020 2:44 PM

## 2020-09-09 NOTE — Plan of Care (Signed)
Patients mood is labile and speech is pressured and tangential.Denies SI,HI and AVH.Patient states " I need to get home and pack my things to move to [place]." Compliant with medications.Patient slept in the evening and hard to woke her up for dinner.Compliant with medications. Did not attend groups.Appetite good.Support and encouragement given.

## 2020-09-10 LAB — GLUCOSE, CAPILLARY
Glucose-Capillary: 330 mg/dL — ABNORMAL HIGH (ref 70–99)
Glucose-Capillary: 461 mg/dL — ABNORMAL HIGH (ref 70–99)
Glucose-Capillary: 190 mg/dL — ABNORMAL HIGH (ref 70–99)
Glucose-Capillary: 266 mg/dL — ABNORMAL HIGH (ref 70–99)

## 2020-09-10 LAB — LDL CHOLESTEROL, DIRECT: Direct LDL: 130.5 mg/dL — ABNORMAL HIGH (ref 0–99)

## 2020-09-10 MED ORDER — INSULIN GLARGINE 100 UNIT/ML ~~LOC~~ SOLN
30.0000 [IU] | Freq: Every day | SUBCUTANEOUS | Status: DC
  Administered 2020-09-10: 30 [IU] via SUBCUTANEOUS
  Filled 2020-09-10 (×2): qty 0.3

## 2020-09-10 MED ORDER — QUETIAPINE FUMARATE ER 300 MG PO TB24
300.0000 mg | ORAL_TABLET | Freq: Every day | ORAL | Status: DC
  Administered 2020-09-10: 300 mg via ORAL
  Filled 2020-09-10 (×2): qty 1

## 2020-09-10 MED ORDER — LINAGLIPTIN 5 MG PO TABS
5.0000 mg | ORAL_TABLET | Freq: Every day | ORAL | Status: DC
  Administered 2020-09-10 – 2020-09-11 (×2): 5 mg via ORAL
  Filled 2020-09-10 (×2): qty 1

## 2020-09-10 MED ORDER — INSULIN ASPART 100 UNIT/ML ~~LOC~~ SOLN
15.0000 [IU] | Freq: Three times a day (TID) | SUBCUTANEOUS | Status: DC
  Administered 2020-09-10 – 2020-09-11 (×4): 15 [IU] via SUBCUTANEOUS
  Filled 2020-09-10 (×4): qty 1

## 2020-09-10 NOTE — Plan of Care (Signed)
  Problem: Education: Goal: Emotional status will improve Outcome: Progressing Goal: Mental status will improve Outcome: Not Progressing   Problem: Safety: Goal: Periods of time without injury will increase Outcome: Progressing

## 2020-09-10 NOTE — Plan of Care (Signed)
Patient denies SI/HI/AVH  Problem: Education: Goal: Emotional status will improve Outcome: Progressing Goal: Mental status will improve Outcome: Progressing   

## 2020-09-10 NOTE — Progress Notes (Signed)
Recreation Therapy Notes  Date: 09/10/2020  Time: 9:30 am   Location: Craft room     Behavioral response: N/A   Intervention Topic: Communication   Discussion/Intervention: Patient did not attend group.   Clinical Observations/Feedback:  Patient did not attend group.   Jordynn Perrier LRT/CTRS         Roshard Rezabek 09/10/2020 11:00 AM 

## 2020-09-10 NOTE — [facility] Group Notes (Signed)
LCSW Group Therapy Note  09/10/2020 2:10 PM  Type of Therapy/Topic:  Group Therapy:  Balance in Life  Participation Level:  Did Not Attend  Description of Group:    This group will address the concept of balance and how it feels and looks when one is unbalanced. Patients will be encouraged to process areas in their lives that are out of balance and identify reasons for remaining unbalanced. Facilitators will guide patients in utilizing problem-solving interventions to address and correct the stressor making their life unbalanced. Understanding and applying boundaries will be explored and addressed for obtaining and maintaining a balanced life. Patients will be encouraged to explore ways to assertively make their unbalanced needs known to significant others in their lives, using other group members and facilitator for support and feedback.  Therapeutic Goals: 1. Patient will identify two or more emotions or situations they have that consume much of in their lives. 2. Patient will identify signs/triggers that life has become out of balance:  3. Patient will identify two ways to set boundaries in order to achieve balance in their lives:  4. Patient will demonstrate ability to communicate their needs through discussion and/or role plays  Summary of Patient Progress: X  Therapeutic Modalities:   Cognitive Behavioral Therapy Solution-Focused Therapy Assertiveness Training  Penni Homans MSW, LCSW 09/10/2020 2:10 PM

## 2020-09-10 NOTE — Progress Notes (Signed)
Patient calm and cooperative during assessment denying SI/HI/AVH. Patient endorses anxiety. Patient stated she had a good day and is hopeful she will leave soon. Patient given education, support, and encouragement to be active in her treatment plan. Patient compliant with medication administration per MD orders. Pt observed interacting appropriately with staff and peers on the unit. Patient being monitored Q 15 minutes for safety per unit protocol. Pt remains safe on the unit.

## 2020-09-10 NOTE — Progress Notes (Signed)
Patient finally woke up to eat dinner and received her evening medication. Patient tolerated medications well.

## 2020-09-10 NOTE — Progress Notes (Signed)
[facility]  09/10/2020 2:16 PM Stephanie Barry  MRN:  [id number]   Subjective:  [age] year old female who presents after intentional ingestion of over 100 tablets of benadryl as suicide attempt in context of worsening mood and inability to sleep. Patient seen one-on-one at bedside. Her speech remains pressured and tangential, but she is no longer exhibiting a labile affect. She is somewhat anxious appearing, but she did not exhibit irritability or tearfulness today. She feels she is starting to notice improvement with Seroquel, and is sleeping better with this medication. She is agreeable to dose increase to assist with remaining symptoms of hypomania. She also reports decreased nightmares with Prazosin. Discussed high glucose readings today, but she adamently declines carb-controlled, diabetic diet citing "picky eating." She is agreeable with increasing insulin coverage and starting Tradjenta. When outpatient she takes Jardiance and Victoza and states her glucose is much tighter controlled. Her hemoglobin A1c of 6.4 suggests this is likely the case. She also states she eats less when she is not in the hospital. Principal Problem: Bipolar II disorder, severe, depressed, with anxious distress (HCC) Diagnosis: Principal Problem:   Bipolar II disorder, severe, depressed, with anxious distress (HCC) Active Problems:   Hypothyroidism   Cannabis use disorder, moderate, dependence (HCC)   Diabetes (HCC)   Opioid use disorder, severe, in early remission (HCC)   Cocaine abuse (HCC)  Total Time spent with patient: 45 minutes  Past Psychiatric History:  H/o depression, mainly opiod use disorder on suboxone maintenance, cannabis abuse  Hospitalizations 2-3 times in the past, last about 8 months ago, has hx of rehab for susbtance use. Hx of OD on pill about 4 yrs ago.  [facility] [place], Whitney MuseKrishna Kolhary, NP  Past Medical History:  Past Medical History:  Diagnosis Date  .  Diabetes mellitus without complication (HCC)   . Hepatitis C   . Thyroid disease     Past Surgical History:  Procedure Laterality Date  . TUBAL LIGATION     Family History:  Family History  Problem Relation Age of Onset  . Diabetes Mellitus II Mother   . Diabetes Mellitus II Father    Family Psychiatric  History: Cousin and mother with bipolar disorder, cousin with prior suicide attempts Social History:  Social History   Substance and Sexual Activity  Alcohol Use No     Social History   Substance and Sexual Activity  Drug Use Yes  . Types: Marijuana, Cocaine    Social History   Socioeconomic History  . Marital status: Married    Spouse name: Peyton NajjarLarry   . Number of children: 5  . Years of education: Not on file  . Highest education level: Not on file  Occupational History  . Not on file  Tobacco Use  . Smoking status: Former Games developermoker  . Smokeless tobacco: Never Used  Vaping Use  . Vaping Use: Never used  Substance and Sexual Activity  . Alcohol use: No  . Drug use: Yes    Types: Marijuana, Cocaine  . Sexual activity: Yes  Other Topics Concern  . Not on file  Social History Narrative  . Not on file   Social Determinants of Health   Financial Resource Strain:   . Difficulty of Paying Living Expenses: Not on file  Food Insecurity:   . Worried About Programme researcher, broadcasting/film/videounning Out of Food in the Last Year: Not on file  . Ran Out of Food in the Last Year: Not on file  Transportation Needs:   .  Lack of Transportation (Medical): Not on file  . Lack of Transportation (Non-Medical): Not on file  Physical Activity:   . Days of Exercise per Week: Not on file  . Minutes of Exercise per Session: Not on file  Stress:   . Feeling of Stress : Not on file  Social Connections:   . Frequency of Communication with Friends and Family: Not on file  . Frequency of Social Gatherings with Friends and Family: Not on file  . Attends Religious Services: Not on file  . Active Member of Clubs or  Organizations: Not on file  . Attends Banker Meetings: Not on file  . Marital Status: Not on file   Additional Social History:                         Sleep: Fair  Appetite:  Fair  Current Medications: Current Facility-Administered Medications  Medication Dose Route Frequency Provider Last Rate Last Admin  . acetaminophen (TYLENOL) tablet 650 mg  650 mg Oral Q6H PRN Nwoko, Uchenna E, PA   650 mg at 09/10/20 0751  . alum & mag hydroxide-simeth (MAALOX/MYLANTA) 200-200-20 MG/5ML suspension 30 mL  30 mL Oral Q4H PRN Nwoko, Uchenna E, PA   30 mL at 09/08/20 1939  . buprenorphine-naloxone (SUBOXONE) 8-2 mg per SL tablet 1 tablet  1 tablet Sublingual TID Jesse Sans, MD   1 tablet at 09/10/20 1238  . gabapentin (NEURONTIN) capsule 800 mg  800 mg Oral QID Nwoko, Uchenna E, PA   800 mg at 09/10/20 1238  . hydrOXYzine (ATARAX/VISTARIL) tablet 25 mg  25 mg Oral TID PRN Nwoko, Uchenna E, PA   25 mg at 09/09/20 2048  . insulin aspart (novoLOG) injection 0-15 Units  0-15 Units Subcutaneous TID WC Jesse Sans, MD   15 Units at 09/10/20 1237  . insulin aspart (novoLOG) injection 15 Units  15 Units Subcutaneous TID WC Jesse Sans, MD   15 Units at 09/10/20 1236  . insulin glargine (LANTUS) injection 30 Units  30 Units Subcutaneous QHS Jesse Sans, MD      . linagliptin (TRADJENTA) tablet 5 mg  5 mg Oral Daily Jesse Sans, MD   5 mg at 09/10/20 1238  . magnesium hydroxide (MILK OF MAGNESIA) suspension 30 mL  30 mL Oral Daily PRN Nwoko, Uchenna E, PA      . metFORMIN (GLUCOPHAGE) tablet 1,000 mg  1,000 mg Oral BID Nwoko, Uchenna E, PA   1,000 mg at 09/10/20 0746  . prazosin (MINIPRESS) capsule 1 mg  1 mg Oral QHS Jesse Sans, MD   1 mg at 09/09/20 2049  . QUEtiapine (SEROQUEL XR) 24 hr tablet 300 mg  300 mg Oral QHS Jesse Sans, MD      . QUEtiapine (SEROQUEL) tablet 50 mg  50 mg Oral Daily Jesse Sans, MD   50 mg at 09/10/20 0746  . traZODone  (DESYREL) tablet 50 mg  50 mg Oral QHS PRN Nwoko, Uchenna E, PA   50 mg at 09/09/20 2048    Lab Results:  Results for orders placed or performed during the hospital encounter of 09/07/20 (from the past 48 hour(s))  Glucose, capillary     Status: Abnormal   Collection Time: 09/08/20  4:10 PM  Result Value Ref Range   Glucose-Capillary 299 (H) 70 - 99 mg/dL    Comment: Glucose reference range applies only to samples taken after fasting for  at least 8 hours.  Glucose, capillary     Status: Abnormal   Collection Time: 09/08/20  8:17 PM  Result Value Ref Range   Glucose-Capillary 190 (H) 70 - 99 mg/dL    Comment: Glucose reference range applies only to samples taken after fasting for at least 8 hours.   Comment 1 Notify RN   Glucose, capillary     Status: Abnormal   Collection Time: 09/09/20  7:13 AM  Result Value Ref Range   Glucose-Capillary 221 (H) 70 - 99 mg/dL    Comment: Glucose reference range applies only to samples taken after fasting for at least 8 hours.   Comment 1 Notify RN   Lipid panel     Status: Abnormal   Collection Time: 09/09/20  7:26 AM  Result Value Ref Range   Cholesterol 226 (H) 0 - 200 mg/dL   Triglycerides 027 (H) <150 mg/dL   HDL 42 >25 mg/dL   Total CHOL/HDL Ratio 5.4 RATIO   VLDL UNABLE TO CALCULATE IF TRIGLYCERIDE OVER 400 mg/dL 0 - 40 mg/dL   LDL Cholesterol UNABLE TO CALCULATE IF TRIGLYCERIDE OVER 400 mg/dL 0 - 99 mg/dL    Comment:        Total Cholesterol/HDL:CHD Risk Coronary Heart Disease Risk Table                     Men   Women  1/2 Average Risk   3.4   3.3  Average Risk       5.0   4.4  2 X Average Risk   9.6   7.1  3 X Average Risk  23.4   11.0        Use the calculated Patient Ratio above and the CHD Risk Table to determine the patient's CHD Risk.        ATP III CLASSIFICATION (LDL):  <100     mg/dL   Optimal  366-440  mg/dL   Near or Above                    Optimal  130-159  mg/dL   Borderline  347-425  mg/dL   High  >956      mg/dL   Very High Performed at [facility], [address] [place], [place] [address]   Hemoglobin A1c     Status: Abnormal   Collection Time: 09/09/20  7:26 AM  Result Value Ref Range   Hgb A1c MFr Bld 6.4 (H) 4.8 - 5.6 %    Comment: (NOTE) Pre diabetes:          5.7%-6.4%  Diabetes:              >6.4%  Glycemic control for   <7.0% adults with diabetes    Mean Plasma Glucose 136.98 mg/dL    Comment: Performed at [facility], [address] [place], [place] [address]  LDL cholesterol, direct     Status: Abnormal   Collection Time: 09/09/20  7:26 AM  Result Value Ref Range   Direct LDL 130.5 (H) 0 - 99 mg/dL    Comment: Performed at [facility], [address] [place], [place] [address]  Glucose, capillary     Status: Abnormal   Collection Time: 09/09/20 11:46 AM  Result Value Ref Range   Glucose-Capillary 356 (H) 70 - 99 mg/dL    Comment: Glucose reference range applies only to samples taken after fasting for at least 8  hours.  Glucose, capillary     Status: Abnormal   Collection Time: 09/09/20  5:20 PM  Result Value Ref Range   Glucose-Capillary 254 (H) 70 - 99 mg/dL    Comment: Glucose reference range applies only to samples taken after fasting for at least 8 hours.  Glucose, capillary     Status: Abnormal   Collection Time: 09/09/20  8:47 PM  Result Value Ref Range   Glucose-Capillary 228 (H) 70 - 99 mg/dL    Comment: Glucose reference range applies only to samples taken after fasting for at least 8 hours.   Comment 1 Notify RN   Glucose, capillary     Status: Abnormal   Collection Time: 09/10/20  7:00 AM  Result Value Ref Range   Glucose-Capillary 330 (H) 70 - 99 mg/dL    Comment: Glucose reference range applies only to samples taken after fasting for at least 8 hours.  Glucose, capillary     Status: Abnormal   Collection Time: 09/10/20 11:40 AM  Result Value Ref Range   Glucose-Capillary 461 (H) 70 - 99 mg/dL    Comment: Glucose  reference range applies only to samples taken after fasting for at least 8 hours.   Comment 1 Notify RN    Comment 2 Document in Chart     Blood Alcohol level:  Lab Results  Component Value Date   ETH <10 09/05/2020   ETH <10 06/13/2020    Metabolic Disorder Labs: Lab Results  Component Value Date   HGBA1C 6.4 (H) 09/09/2020   MPG 136.98 09/09/2020   MPG 291.96 04/07/2019   No results found for: PROLACTIN Lab Results  Component Value Date   CHOL 226 (H) 09/09/2020   TRIG 516 (H) 09/09/2020   HDL 42 09/09/2020   CHOLHDL 5.4 09/09/2020   VLDL UNABLE TO CALCULATE IF TRIGLYCERIDE OVER 400 mg/dL 91/47/8295   LDLCALC UNABLE TO CALCULATE IF TRIGLYCERIDE OVER 400 mg/dL 62/13/0865   LDLCALC UNABLE TO CALCULATE IF TRIGLYCERIDE OVER 400 mg/dL 78/46/9629    Physical Findings: AIMS: Facial and Oral Movements Muscles of Facial Expression: None, normal Lips and Perioral Area: None, normal Jaw: None, normal Tongue: None, normal,Extremity Movements Upper (arms, wrists, hands, fingers): None, normal Lower (legs, knees, ankles, toes): None, normal, Trunk Movements Neck, shoulders, hips: None, normal, Overall Severity Severity of abnormal movements (highest score from questions above): None, normal Incapacitation due to abnormal movements: None, normal Patient's awareness of abnormal movements (rate only patient's report): No Awareness, Dental Status Current problems with teeth and/or dentures?: No Does patient usually wear dentures?: No  CIWA:    COWS:     Musculoskeletal: Strength & Muscle Tone: within normal limits Gait & Station: normal Patient leans: N/A  Psychiatric Specialty Exam: Physical Exam Vitals and nursing note reviewed.  Constitutional:      Appearance: Normal appearance.  HENT:     Head: Normocephalic and atraumatic.     Right Ear: External ear normal.     Left Ear: External ear normal.     Nose: Nose normal.     Mouth/Throat:     Mouth: Mucous membranes  are moist.     Pharynx: Oropharynx is clear.  Eyes:     Extraocular Movements: Extraocular movements intact.     Conjunctiva/sclera: Conjunctivae normal.     Pupils: Pupils are equal, round, and reactive to light.  Cardiovascular:     Rate and Rhythm: Tachycardia present.     Pulses: Normal pulses.  Pulmonary:  Effort: Pulmonary effort is normal.     Breath sounds: Normal breath sounds.  Abdominal:     General: Abdomen is flat.     Palpations: Abdomen is soft.  Musculoskeletal:        General: No swelling. Normal range of motion.     Cervical back: Normal range of motion and neck supple.  Skin:    General: Skin is warm and dry.  Neurological:     General: No focal deficit present.     Mental Status: She is alert and oriented to person, place, and time.  Psychiatric:        Attention and Perception: Attention normal.        Mood and Affect: Mood is anxious. Affect is not labile or tearful.        Speech: Speech is rapid and pressured and tangential.        Behavior: Behavior is hyperactive.        Thought Content: Thought content does not include homicidal or suicidal ideation.        Cognition and Memory: Memory normal. Cognition is not impaired.        Judgment: Judgment is impulsive.     Review of Systems  Constitutional: Negative for appetite change and fatigue.  HENT: Negative for rhinorrhea and sore throat.   Eyes: Negative for photophobia and visual disturbance.  Respiratory: Negative for cough and shortness of breath.   Cardiovascular: Negative for chest pain and palpitations.  Gastrointestinal: Negative for constipation, diarrhea, nausea and vomiting.  Endocrine: Negative for cold intolerance and heat intolerance.  Genitourinary: Negative for difficulty urinating and dysuria.  Musculoskeletal: Negative for arthralgias and myalgias.  Skin: Negative for rash and wound.  Allergic/Immunologic: Negative for environmental allergies and food allergies.  Neurological:  Negative for dizziness and headaches.  Hematological: Negative for adenopathy. Does not bruise/bleed easily.  Psychiatric/Behavioral: Positive for decreased concentration and sleep disturbance. The patient is nervous/anxious and is hyperactive.     Blood pressure 120/89, pulse (!) 118, temperature 98.6 F (37 C), temperature source Oral, resp. rate 19, height 5\' 7"  (1.702 m), weight 83.9 kg, SpO2 98 %.Body mass index is 28.97 kg/m.  General Appearance: Fairly Groomed  Eye Contact:  Good  Speech:  Pressured  Volume:  Normal  Mood:  Anxious  Affect:  Congruent  Thought Process:  Coherent  Orientation:  Full (Time, Place, and Person)  Thought Content:  Logical and Tangential  Suicidal Thoughts:  No  Homicidal Thoughts:  No  Memory:  Immediate;   Fair Recent;   Fair Remote;   Fair  Judgement:  Fair  Insight:  Fair  Psychomotor Activity:  Restlessness  Concentration:  Concentration: Fair and Attention Span: Fair  Recall:  of Knowledge:  Fair  Language:  Fair  Akathisia:  Negative  Handed:  Right  AIMS (if indicated):     Assets:  Communication Skills Desire for Improvement Financial Resources/Insurance Housing Social Support  ADL's:  Intact  Cognition:  WNL  Sleep:  Number of Hours: 6.3     Treatment Plan Summary: Daily contact with patient to assess and evaluate symptoms and progress in treatment and Medication management Plan Increase seroquel to 50 mg daily, and 300 mg nightly. Continue prazosin 1 mg QHS for nightmares. Increase Lantus 30 units QHS, mealtime Novolog 15 units TID with meals, sliding scale Novolog, and Tradjenta 5 mg daily for glycemic control. Appreciate assistance from diabetic coordinator.   [employer], MD 09/10/2020, 2:16 PM

## 2020-09-10 NOTE — Plan of Care (Signed)
D- Patient alert and oriented. Patient presents in a preoccupied, but pleasant mood on assessment stating that she slept ok last night. Patient had complaints of leg pain, rating it a "6/10", in which she did request pain medication. Patient denies depression, but endorsed anxiety, reporting that she is "thinking about Thanksgiving", and is worried that she will be here during the holiday. Patient also denies SI, HI, AVH at this time. Patient had no stated goals for today.  A- Scheduled medications administered to patient, per MD orders. Support and encouragement provided.  Routine safety checks conducted every 15 minutes.  Patient informed to notify staff with problems or concerns.  R- No adverse drug reactions noted. Patient contracts for safety at this time. Patient compliant with medications. Patient receptive, calm, and cooperative. Patient interacts well with others on the unit.  Patient remains safe at this time.  Problem: Education: Goal: Knowledge of [facility] Education information/materials will improve Outcome: Progressing Goal: Emotional status will improve Outcome: Progressing Goal: Mental status will improve Outcome: Progressing Goal: Verbalization of understanding the information provided will improve Outcome: Progressing   Problem: Safety: Goal: Periods of time without injury will increase Outcome: Progressing   Problem: Education: Goal: Ability to make informed decisions regarding treatment will improve Outcome: Progressing   Problem: Self-Concept: Goal: Ability to disclose and discuss suicidal ideas will improve Outcome: Progressing Goal: Will verbalize positive feelings about self Outcome: Progressing

## 2020-09-10 NOTE — Progress Notes (Signed)
Inpatient Diabetes Program Recommendations  AACE/ADA: New Consensus Statement on Inpatient Glycemic Control (2015)  Target Ranges:  Prepandial:   less than 140 mg/dL      Peak postprandial:   less than 180 mg/dL (1-2 hours)      Critically ill patients:  140 - 180 mg/dL   Lab Results  Component Value Date   GLUCAP 330 (H) 09/10/2020   HGBA1C 6.4 (H) 09/09/2020    Review of Glycemic Control Results for Stephanie Barry, Stephanie Barry (MRN [id number]) as of 09/10/2020 09:13  Ref. Range 09/09/2020 11:46 09/09/2020 17:20 09/09/2020 20:47 09/10/2020 07:00  Glucose-Capillary Latest Ref Range: 70 - 99 mg/dL 409 (H) 811 (H) 914 (H) 330 (H)   Inpatient Diabetes Program Recommendations:    CBGs continue to be elevated. Fasting and postprandial -Increase Lantus to 30 units qd -Increase Novolog to 15 units tid meal coverage if eats 50% Secure chat sent to Dr. Neale Burly.  Thank you, Billy Fischer. Dejean Tribby, RN, MSN, CDE  Diabetes Coordinator Inpatient Glycemic Control Team Team Pager [phone] (8am-5pm) 09/10/2020 9:28 AM

## 2020-09-10 NOTE — Progress Notes (Signed)
Patient is asleep. Staff has attempted to arouse patient several times to no avail. Staff will continue to try to wake patient for evening CBG check, as well as, evening medication pass.

## 2020-09-10 NOTE — Progress Notes (Signed)
Patient presents anxious and slightly irritated but calms and brightens on approach. Pt noted on phone yelling and cursing at spouse, reports he sent the wrong clothes here for her to wear. Pt did calm when he stated he would bring her what she asked for tomorrow. Pt labile with pressured speech. Denies any SI, HI, AVH. Visible in milieu. Pt with CBG of 228, insulin given as well as [place] tray. Pt states she could not eat any of the snacks offered. Encouragement and support provided. Medications given as prescribed. Prn given for sleep with good relief. Pt receptive and remains safe on unit with q 15 min checks. Pt currently in bed resting eyes closed in no distress.

## 2020-09-11 LAB — GLUCOSE, CAPILLARY
Glucose-Capillary: 212 mg/dL — ABNORMAL HIGH (ref 70–99)
Glucose-Capillary: 259 mg/dL — ABNORMAL HIGH (ref 70–99)

## 2020-09-11 MED ORDER — VICTOZA 18 MG/3ML ~~LOC~~ SOPN: 1.2000 mg | PEN_INJECTOR | Freq: Every day | SUBCUTANEOUS | 0 refills | Status: AC

## 2020-09-11 MED ORDER — QUETIAPINE FUMARATE 50 MG PO TABS: 50.0000 mg | ORAL_TABLET | Freq: Every day | ORAL | 1 refills | Status: AC

## 2020-09-11 MED ORDER — HYDROXYZINE HCL 25 MG PO TABS: 25.0000 mg | ORAL_TABLET | Freq: Three times a day (TID) | ORAL | 1 refills | Status: AC | PRN

## 2020-09-11 MED ORDER — TRAZODONE HCL 50 MG PO TABS: 50.0000 mg | ORAL_TABLET | Freq: Every evening | ORAL | 1 refills | Status: AC | PRN

## 2020-09-11 MED ORDER — PRAZOSIN HCL 1 MG PO CAPS: 1.0000 mg | ORAL_CAPSULE | Freq: Every day | ORAL | 1 refills | Status: AC

## 2020-09-11 MED ORDER — QUETIAPINE FUMARATE ER 300 MG PO TB24: 300.0000 mg | ORAL_TABLET | Freq: Every day | ORAL | 1 refills | Status: AC

## 2020-09-11 MED ORDER — INSULIN GLARGINE 100 UNIT/ML ~~LOC~~ SOLN: 20.0000 [IU] | Freq: Every day | SUBCUTANEOUS | 0 refills | Status: AC

## 2020-09-11 MED ORDER — JARDIANCE 25 MG PO TABS: 25.0000 mg | ORAL_TABLET | Freq: Every day | ORAL | 0 refills | Status: AC

## 2020-09-11 NOTE — [facility] Suicide Risk Assessment (Signed)
[facility] Discharge Suicide Risk Assessment   Principal Problem: Bipolar II disorder, severe, depressed, with anxious distress (HCC) Discharge Diagnoses: Principal Problem:   Bipolar II disorder, severe, depressed, with anxious distress (HCC) Active Problems:   Hypothyroidism   Cannabis use disorder, moderate, dependence (HCC)   Diabetes (HCC)   Opioid use disorder, severe, in early remission (HCC)   Cocaine abuse (HCC)   Total Time spent with patient: 30 minutes  Musculoskeletal: Strength & Muscle Tone: within normal limits Gait & Station: normal Patient leans: N/A  Psychiatric Specialty Exam: Review of Systems  Constitutional: Negative for appetite change and fatigue.  HENT: Negative for rhinorrhea and sore throat.   Eyes: Negative for photophobia and visual disturbance.  Respiratory: Negative for cough and shortness of breath.   Cardiovascular: Negative for chest pain and palpitations.  Gastrointestinal: Negative for constipation, diarrhea, nausea and vomiting.  Endocrine: Negative for cold intolerance and heat intolerance.  Genitourinary: Negative for difficulty urinating and dysuria.  Musculoskeletal: Negative for arthralgias and myalgias.  Skin: Negative for rash and wound.  Allergic/Immunologic: Negative for environmental allergies and food allergies.  Neurological: Negative for dizziness and headaches.  Hematological: Negative for adenopathy. Does not bruise/bleed easily.  Psychiatric/Behavioral: Negative for behavioral problems, hallucinations and suicidal ideas. The patient is not nervous/anxious.     Blood pressure 130/81, pulse (!) 116, temperature 100.3 F (37.9 C), temperature source Oral, resp. rate 18, height 5\' 7"  (1.702 m), weight 83.9 kg, SpO2 99 %.Body mass index is 28.97 kg/m.  General Appearance: Fairly Groomed  ::  Fair  Speech:  Clear and Coherent and Normal Rate409  Volume:  Normal  Mood:  Euthymic  Affect:  Congruent  Thought Process:   Coherent and Linear  Orientation:  Full (Time, Place, and Person)  Thought Content:  Logical  Suicidal Thoughts:  No  Homicidal Thoughts:  No  Memory:  Immediate;   Fair Recent;   Fair Remote;   Fair  Judgement:  Intact  Insight:  Fair  Psychomotor Activity:  Normal  Concentration:  Fair  Recall:  [phone] of Knowledge:Fair  Language: Fair  Akathisia:  Negative  Handed:  Right  AIMS (if indicated):     Assets:  Communication Skills Desire for Improvement Financial Resources/Insurance Housing Intimacy Leisure Time Resilience Social Support  Sleep:  Number of Hours: 3.5  Cognition: WNL  ADL's:  Intact   Mental Status Per Nursing Assessment::   On Admission:  Self-harm thoughts  Demographic Factors:  Caucasian  Loss Factors: NA  Historical Factors: Prior suicide attempts and Impulsivity  Risk Reduction Factors:   Responsible for children under 39 years of age, Sense of responsibility to family, Living with another person, especially a relative, Positive social support, Positive therapeutic relationship and Positive coping skills or problem solving skills  Continued Clinical Symptoms:  Bipolar Disorder:   Depressive phase Previous Psychiatric Diagnoses and Treatments Medical Diagnoses and Treatments/Surgeries  Cognitive Features That Contribute To Risk:  None    Suicide Risk:  Mild:  Suicidal ideation of limited frequency, intensity, duration, and specificity.  There are no identifiable plans, no associated intent, mild dysphoria and related symptoms, good self-control (both objective and subjective assessment), few other risk factors, and identifiable protective factors, including available and accessible social support.   Follow-up Information    patient declined. Follow up.   Why: patient declined Contact information: patient declined              Plan Of Care/Follow-up recommendations:  Activity:  as tolerated Diet:  regular diet  Jesse Sans, MD 09/11/2020, 10:16 AM

## 2020-09-11 NOTE — Progress Notes (Signed)
D: Pt alert and oriented. Pt denies experiencing any pain, SI/HI, or AVH at this time. Pt reports she will be able to keep herself safe when they return home.   A: Pt received discharge and medication education/information. Pt belongings were returned and signed for at this time, to include printed prescriptions.   R: Pt verbalized understanding of discharge and medication education/information.  Pt escorted by staff to the medical mall front lobby where pt's husband picked her up.

## 2020-09-11 NOTE — Progress Notes (Signed)
  [facility] Adult Case Management Discharge Plan :  Will you be returning to the same living situation after discharge:  Yes,  pt reports that she is returning home. At discharge, do you have transportation home?: Yes,  pt reports her husband will provide transportation Do you have the ability to pay for your medications: Yes,  Medicare  Release of information consent forms completed and in the chart;  Patient's signature needed at discharge.  Patient to Follow up at:  Follow-up Information    patient declined. Follow up.   Why: patient declined Contact information: patient declined              Next level of care provider has access to [facility] Link:no  Safety Planning and Suicide Prevention discussed: Yes,  SPE completed with patient and patient's husband.  Have you used any form of tobacco in the last 30 days? (Cigarettes, Smokeless Tobacco, Cigars, and/or Pipes): Yes  Has patient been referred to the Quitline?: Patient refused referral  Patient has been referred for addiction treatment: Pt. refused referral  Harden Mo, LCSW 09/11/2020, 9:55 AM

## 2020-09-11 NOTE — BHH Counselor (Signed)
Patient is declining aftercare at this time.  CSW encouraged patient to think about decision.   CSW will provide patient with local resources.   Penni Homans, MSW, LCSW 09/11/2020 9:54 AM

## 2020-09-11 NOTE — Progress Notes (Signed)
D: Pt alert and oriented. Pt rates depression 0/10 and anxiety 0/10. Pt reports experiencing 6/10 bilat leg pain, scheduled meds given no further c/o pain. Pt denies experiencing any SI/HI, or AVH at this time.   A: Scheduled medications administered to pt, per MD orders. Support and encouragement provided. Frequent verbal contact made. Routine safety checks conducted q15 minutes.   R: No adverse drug reactions noted. Pt verbally contracts for safety at this time. Pt complaint with medications. Pt interacts well with others on the unit. Pt remains safe at this time. Will continue to monitor.

## 2020-09-11 NOTE — Discharge Summary (Signed)
Physician Discharge Summary Note  Patient:  Stephanie Barry is an 39 y.o., female MRN:  [id number] DOB:  [birth year] Patient phone:  [phone] (home)  Patient address:   [address] [place] [address],  Total Time spent with patient: 30 minutes  Date of Admission:  09/07/2020 Date of Discharge: 09/11/2020  Reason for Admission:  Worsening mood and suicide attempt via intentional Benadryl ingestion  Principal Problem: Bipolar II disorder, severe, depressed, with anxious distress (HCC) Discharge Diagnoses: Principal Problem:   Bipolar II disorder, severe, depressed, with anxious distress (HCC) Active Problems:   Hypothyroidism   Cannabis use disorder, moderate, dependence (HCC)   Diabetes (HCC)   Opioid use disorder, severe, in early remission (HCC)   Cocaine abuse (HCC)   Past Psychiatric History:  H/o depression, mainly opiod use disorder on suboxone maintenance, cannabis abuse  Hospitalizations 2-3 times in the past, last about 8 months ago, has hx of rehab for susbtance use. Hx of OD on pill about 4 yrs ago.  OP provider- [facility], [place], Whitney Muse, NP  Past Medical History:  Past Medical History:  Diagnosis Date  . Diabetes mellitus without complication (HCC)   . Hepatitis C   . Thyroid disease     Past Surgical History:  Procedure Laterality Date  . TUBAL LIGATION     Family History:  Family History  Problem Relation Age of Onset  . Diabetes Mellitus II Mother   . Diabetes Mellitus II Father    Family Psychiatric  History: Cousin and mother with bipolar disorder, cousin with prior suicide attempts Social History:  Social History   Substance and Sexual Activity  Alcohol Use No     Social History   Substance and Sexual Activity  Drug Use Yes  . Types: Marijuana, Cocaine    Social History   Socioeconomic History  . Marital status: Married    Spouse name: Peyton Najjar   . Number of children: 5  . Years of education: Not on  file  . Highest education level: Not on file  Occupational History  . Not on file  Tobacco Use  . Smoking status: Former Games developer  . Smokeless tobacco: Never Used  Vaping Use  . Vaping Use: Never used  Substance and Sexual Activity  . Alcohol use: No  . Drug use: Yes    Types: Marijuana, Cocaine  . Sexual activity: Yes  Other Topics Concern  . Not on file  Social History Narrative  . Not on file   Social Determinants of Health   Financial Resource Strain:   . Difficulty of Paying Living Expenses: Not on file  Food Insecurity:   . Worried About Programme researcher, broadcasting/film/video in the Last Year: Not on file  . Ran Out of Food in the Last Year: Not on file  Transportation Needs:   . Lack of Transportation (Medical): Not on file  . Lack of Transportation (Non-Medical): Not on file  Physical Activity:   . Days of Exercise per Week: Not on file  . Minutes of Exercise per Session: Not on file  Stress:   . Feeling of Stress : Not on file  Social Connections:   . Frequency of Communication with Friends and Family: Not on file  . Frequency of Social Gatherings with Friends and Family: Not on file  . Attends Religious Services: Not on file  . Active Member of Clubs or Organizations: Not on file  . Attends Banker Meetings: Not on file  . Marital  Status: Not on file    Hospital Course: Patient presented to emergency room after intentional ingestion of 100 tablets of Benadryl in apparent suicide attempt. She was inititially admitted to the medical floor for observation and supportive care, and then transferred to the BMU. Upon admission she was extremely labile and manic appearing. She notes that she will often go several days without sleeping and have tons of energy. She notes that during those times her mood is extremely labile and she will go from euphoric, to irate, to tearful. Diagnosis most consistent with Bipolar II disorder. Remeron and Effexor were discontinued during hospital  admission, and she was started on Seroquel and titrated to 50 mg daily, and 300 mg nightly. Mood improved on this regimen, and lability resolved. She was also able to sleep much better. She denied suicidal ideations, homicidal ideations, visual hallucinations, and auditory hallucinations. At time of discharge patient, patient's husband, and treatment team felt she was safe for discharge with close outpatient follow-up.   Physical Findings: AIMS: Facial and Oral Movements Muscles of Facial Expression: None, normal Lips and Perioral Area: None, normal Jaw: None, normal Tongue: None, normal,Extremity Movements Upper (arms, wrists, hands, fingers): None, normal Lower (legs, knees, ankles, toes): None, normal, Trunk Movements Neck, shoulders, hips: None, normal, Overall Severity Severity of abnormal movements (highest score from questions above): None, normal Incapacitation due to abnormal movements: None, normal Patient's awareness of abnormal movements (rate only patient's report): No Awareness, Dental Status Current problems with teeth and/or dentures?: No Does patient usually wear dentures?: No  CIWA:    COWS:     Musculoskeletal: Strength & Muscle Tone: within normal limits Gait & Station: normal Patient leans: N/A  Psychiatric Specialty Exam: Physical Exam Vitals and nursing note reviewed.  HENT:     Head: Normocephalic and atraumatic.     Right Ear: External ear normal.     Left Ear: External ear normal.     Nose: Nose normal.     Mouth/Throat:     Mouth: Mucous membranes are moist.     Pharynx: Oropharynx is clear.  Eyes:     Extraocular Movements: Extraocular movements intact.     Conjunctiva/sclera: Conjunctivae normal.     Pupils: Pupils are equal, round, and reactive to light.  Cardiovascular:     Rate and Rhythm: Tachycardia present.     Pulses: Normal pulses.  Pulmonary:     Effort: Pulmonary effort is normal.     Breath sounds: Normal breath sounds.  Abdominal:      General: Abdomen is flat.     Palpations: Abdomen is soft.  Musculoskeletal:        General: No swelling. Normal range of motion.     Cervical back: Normal range of motion and neck supple.  Skin:    General: Skin is warm and dry.  Neurological:     General: No focal deficit present.     Mental Status: She is oriented to person, place, and time.  Psychiatric:        Mood and Affect: Mood normal.        Behavior: Behavior normal.        Thought Content: Thought content normal.        Judgment: Judgment normal.     Review of Systems  Constitutional: Negative for activity change and fatigue.  HENT: Negative for rhinorrhea and sore throat.   Eyes: Negative for photophobia and visual disturbance.  Respiratory: Negative for cough and shortness of breath.  Cardiovascular: Negative for chest pain and palpitations.  Gastrointestinal: Negative for constipation, diarrhea, nausea and vomiting.  Endocrine: Negative for cold intolerance and heat intolerance.  Genitourinary: Negative for difficulty urinating and dysuria.  Musculoskeletal: Negative for arthralgias and myalgias.  Skin: Negative for rash and wound.  Allergic/Immunologic: Negative for environmental allergies and food allergies.  Neurological: Negative for dizziness and headaches.  Hematological: Negative for adenopathy. Does not bruise/bleed easily.  Psychiatric/Behavioral: Negative for behavioral problems, hallucinations and suicidal ideas.    Blood pressure 130/81, pulse (!) 116, temperature 100.3 F (37.9 C), temperature source Oral, resp. rate 18, height 5\' 7"  (1.702 m), weight 83.9 kg, SpO2 99 %.Body mass index is 28.97 kg/m.  General Appearance: Fairly Groomed  ::  Fair  Speech:  Clear and Coherent and Normal Rate409  Volume:  Normal  Mood:  Euthymic  Affect:  Congruent  Thought Process:  Coherent and Linear  Orientation:  Full (Time, Place, and Person)  Thought Content:  Logical  Suicidal Thoughts:   No  Homicidal Thoughts:  No  Memory:  Immediate;   Fair Recent;   Fair Remote;   Fair  Judgement:  Intact  Insight:  Fair  Psychomotor Activity:  Normal  Concentration:  Fair  Recall:  Fair  Fund of Knowledge:Fair  Language: Fair  Akathisia:  Negative  Handed:  Right  AIMS (if indicated):     Assets:  Communication Skills Desire for Improvement Financial Resources/Insurance Housing Intimacy Leisure Time Resilience Social Support  Sleep:  Number of Hours: 3.5  Cognition: WNL  ADL's:  Intact        Have you used any form of tobacco in the last 30 days? (Cigarettes, Smokeless Tobacco, Cigars, and/or Pipes): Yes  Has this patient used any form of tobacco in the last 30 days? (Cigarettes, Smokeless Tobacco, Cigars, and/or Pipes) Yes, Yes, A prescription for an FDA-approved tobacco cessation medication was offered at discharge and the patient refused  Blood Alcohol level:  Lab Results  Component Value Date   [facility] <10 09/05/2020   ETH <10 06/13/2020    Metabolic Disorder Labs:  Lab Results  Component Value Date   HGBA1C 6.4 (H) 09/09/2020   MPG 136.98 09/09/2020   MPG 291.96 04/07/2019   No results found for: PROLACTIN Lab Results  Component Value Date   CHOL 226 (H) 09/09/2020   TRIG 516 (H) 09/09/2020   HDL 42 09/09/2020   CHOLHDL 5.4 09/09/2020   VLDL UNABLE TO CALCULATE IF TRIGLYCERIDE OVER 400 mg/dL 13/07/2020   LDLCALC UNABLE TO CALCULATE IF TRIGLYCERIDE OVER 400 mg/dL 66/04/3015   LDLCALC UNABLE TO CALCULATE IF TRIGLYCERIDE OVER 400 mg/dL 11/08/3233    See Psychiatric Specialty Exam and Suicide Risk Assessment completed by Attending Physician prior to discharge.  Discharge destination:  Home  Is patient on multiple antipsychotic therapies at discharge:  No   Has Patient had three or more failed trials of antipsychotic monotherapy by history:  No  Recommended Plan for Multiple Antipsychotic Therapies: NA  Discharge Instructions    Diet Carb  Modified   Complete by: As directed    Increase activity slowly   Complete by: As directed      Allergies as of 09/11/2020   No Known Allergies     Medication List    STOP taking these medications   cetirizine 10 MG tablet Commonly known as: ZYRTEC   mirtazapine 15 MG disintegrating tablet Commonly known as: REMERON SOL-TAB   mirtazapine 30 MG disintegrating tablet Commonly known as:  REMERON SOL-TAB   omeprazole 20 MG capsule Commonly known as: PRILOSEC   venlafaxine XR 75 MG 24 hr capsule Commonly known as: EFFEXOR-XR     TAKE these medications     Indication  Buprenorphine HCl-Naloxone HCl 8-2 MG Film Place under the tongue 3 (three) times daily.  Indication: Opioid Dependence   gabapentin 800 MG tablet Commonly known as: NEURONTIN Take 800 mg by mouth 4 (four) times daily.  Indication: Diabetes with Nerve Disease   hydrOXYzine 25 MG tablet Commonly known as: ATARAX/VISTARIL Take 1 tablet (25 mg total) by mouth 3 (three) times daily as needed for anxiety.  Indication: Feeling Anxious   insulin glargine 100 UNIT/ML injection Commonly known as: LANTUS Inject 0.2 mLs (20 Units total) into the skin daily.  Indication: Type 2 Diabetes   Jardiance 25 MG Tabs tablet Generic drug: empagliflozin Take 1 tablet (25 mg total) by mouth daily.  Indication: Type 2 Diabetes   levothyroxine 100 MCG tablet Commonly known as: SYNTHROID Take 100 mcg by mouth daily.  Indication: Underactive Thyroid   lisinopril 2.5 MG tablet Commonly known as: ZESTRIL Take 2.5 mg by mouth daily.  Indication: High Blood Pressure Disorder   metFORMIN 1000 MG tablet Commonly known as: GLUCOPHAGE Take 1,000 mg by mouth 2 (two) times daily.  Indication: Type 2 Diabetes   naloxone 0.4 MG/ML injection Commonly known as: NARCAN Inject into the muscle.  Indication: Opioid Overdose   prazosin 1 MG capsule Commonly known as: MINIPRESS Take 1 capsule (1 mg total) by mouth at bedtime.   Indication: Frightening Dreams   QUEtiapine 300 MG 24 hr tablet Commonly known as: SEROQUEL XR Take 1 tablet (300 mg total) by mouth at bedtime.  Indication: Manic-Depression   QUEtiapine 50 MG tablet Commonly known as: SEROQUEL Take 1 tablet (50 mg total) by mouth daily. Start taking on: September 12, 2020  Indication: Manic-Depression   traZODone 50 MG tablet Commonly known as: DESYREL Take 1 tablet (50 mg total) by mouth at bedtime as needed for sleep.  Indication: Trouble Sleeping   Victoza 18 MG/3ML Sopn Generic drug: liraglutide Inject 1.2 mg into the skin daily.  Indication: Type 2 Diabetes       Follow-up Information    patient declined. Follow up.   Why: patient declined Contact information: patient declined              Follow-up recommendations:  Activity:  as tolerated Diet:  regular diet  Comments:  30-day scripts with 1 refill provided to patient at discharge.   Signed: Jesse SansMegan M Milia Warth, MD 09/11/2020, 10:23 AM

## 2020-09-11 NOTE — Plan of Care (Signed)
  Problem: Group Participation Goal: STG - Patient will engage in groups without prompting or encouragement from LRT x3 group sessions within 5 recreation therapy group sessions Description: STG - Patient will engage in groups without prompting or encouragement from LRT x3 group sessions within 5 recreation therapy group sessions 09/11/2020 1253 by Ernest Haber, LRT Outcome: Not Applicable 07/86/7544 [id number] by Ernest Haber, LRT Outcome: Not Met (add Reason) Note: Patient spent her time in her room.

## 2020-09-11 NOTE — Progress Notes (Signed)
Recreation Therapy Notes  INPATIENT RECREATION TR PLAN  Patient Details Name: Stephanie Barry MRN: [id number] DOB: 1981-04-04 Today's Date: 09/11/2020  Rec Therapy Plan Is patient appropriate for Therapeutic Recreation?: Yes Treatment times per week: at least 3 Estimated Length of Stay: 5-7 days TR Treatment/Interventions: Group participation (Comment)  Discharge Criteria Pt will be discharged from therapy if:: Discharged Treatment plan/goals/alternatives discussed and agreed upon by:: Patient/family  Discharge Summary Short term goals set: Patient will engage in groups without prompting or encouragement from LRT x3 group sessions within 5 recreation therapy group sessions Short term goals met: Not met Reason goals not met: Patient did not attend any groups Therapeutic equipment acquired: N/A Reason patient discharged from therapy: Discharge from hospital Pt/family agrees with progress & goals achieved: Yes Date patient discharged from therapy: 09/11/20   Timmie Calix 09/11/2020, 12:55 PM

## 2020-09-30 NOTE — Discharge Summary (Signed)
Physician Discharge Summary  Stephanie Barry BPZ:[id number] DOB: 1980/12/13 DOA: 09/05/2020  PCP: [facility]  Admit date: 09/05/2020 Discharge date: 09/30/2020  Admitted From: home Disposition:  Behavioral health unit  Recommendations for Outpatient Follow-up:  1. Follow up with PCP in 1-2 weeks 2. Please obtain BMP/CBC in one week 3. Please follow up with psychiatry  Home Health: No Equipment/Devices: none  Discharge Condition: Stable CODE STATUS: Full Diet recommendation: Heart healthy / Carb modified  Discharge Diagnoses: Principal Problem:   Bipolar depression (HCC) Active Problems:   Hypothyroidism   Cannabis use disorder, moderate, dependence (HCC)   Polysubstance abuse (HCC)   Diabetes mellitus without complication (HCC)   Overdose   Cocaine abuse (HCC)   HTN (hypertension)   Depression   GERD (gastroesophageal reflux disease)   Hypokalemia   Intentional overdose of drug in tablet form (HCC)    Summary of HPI and Hospital Course:  Stephanie Barry is a 39 y.o. female with medical history significant of hypertension, diabetes mellitus, GERD, hypothyroidism, depression, HCV, polysubstance abuse (opiates, cannabis, cocaine, benzo, cocaine), suicidal, who presented to the ED on 09/05/20 with altered mental status due to Benadryl overdose.  Patient's mother reported to admitting physician that this was a suicide attempt, estimated pt took over 100 tablets of Benadryl.    Patient IVC'd in the ED. Psychiatry consulted. Poison control recommended supportive care, monitoring of Qtc.  Admitted to ICU/stepdown with 1:1 sitter.  Required restraints initially and had severe agitation with many reports of patient verbally assaulting staff and refusing care.  11/7 - pt off restraints, calm, but irritable about being kept in hospital.  Seen by psychiatry today.  Plan is to discharge to behavioral health unit pending medical clearance.  11/8 - pt medically clear and was  re-evaluated by psychiatry, continued to be high risk for recurrent self-harming behaviors and was ultimately discharged to psych unit.      Discharge Instructions    Allergies as of 09/07/2020   No Known Allergies     Medication List    ASK your doctor about these medications   Buprenorphine HCl-Naloxone HCl 8-2 MG Film Place under the tongue 3 (three) times daily.   gabapentin 800 MG tablet Commonly known as: NEURONTIN Take 800 mg by mouth 4 (four) times daily.   levothyroxine 100 MCG tablet Commonly known as: SYNTHROID Take 100 mcg by mouth daily.   lisinopril 2.5 MG tablet Commonly known as: ZESTRIL Take 2.5 mg by mouth daily.   metFORMIN 1000 MG tablet Commonly known as: GLUCOPHAGE Take 1,000 mg by mouth 2 (two) times daily.   naloxone 0.4 MG/ML injection Commonly known as: NARCAN Inject into the muscle.       No Known Allergies  Consultations:  Psychiatry   Procedures/Studies: DG Chest Portable 1 View  Result Date: 09/05/2020 CLINICAL DATA:  Initial evaluation for acute hypoxia, overdose. EXAM: PORTABLE CHEST 1 VIEW COMPARISON:  Prior radiograph from 06/13/2020. FINDINGS: Cardiac and mediastinal silhouettes are stable, and remain within normal limits. Lungs are hypoinflated. Secondary diffuse bronchovascular crowding with mild bibasilar atelectasis. No other active cardiopulmonary disease. No definite focal infiltrates. No edema or effusion. No pneumothorax. No acute osseous finding. IMPRESSION: 1. Shallow lung inflation with secondary diffuse bronchovascular crowding and mild bibasilar atelectasis. 2. No other active cardiopulmonary disease. Electronically Signed   By: Rise Mu M.D.   On: 09/05/2020 06:10   SLEEP STUDY DOCUMENTS  Result Date: 09/14/2020 Ordered by an unspecified provider.      Subjective:  Pt seen with sitter at bedside.  In better spirits today.  But she becomes quite tearful when discussing transfer to psych unit.  Says  she needs to get home to care for her children.   Discharge Exam: Vitals:   09/07/20 1959 09/07/20 2203  BP: 111/72 100/89  Pulse: (!) 111 (!) 110  Resp: 20 18  Temp: 99.4 F (37.4 C) 99 F (37.2 C)  SpO2: 98% 98%   Vitals:   09/07/20 1625 09/07/20 1810 09/07/20 1959 09/07/20 2203  BP: 120/65 112/72 111/72 100/89  Pulse: (!) 111 (!) 104 (!) 111 (!) 110  Resp: 20 20 20 18   Temp: 98.8 F (37.1 C) 99.6 F (37.6 C) 99.4 F (37.4 C) 99 F (37.2 C)  TempSrc: Oral Oral Oral   SpO2: 98% 96% 98% 98%  Weight:      Height:        General: Pt is alert, awake, not in acute distress Cardiovascular: RRR, S1/S2 +, no rubs, no gallops Respiratory: CTA bilaterally, no wheezing, no rhonchi Abdominal: Soft, NT, ND, bowel sounds + Extremities: no edema, no cyanosis    The results of significant diagnostics from this hospitalization (including imaging, microbiology, ancillary and laboratory) are listed below for reference.     Microbiology: No results found for this or any previous visit (from the past 240 hour(s)).   Labs: BNP (last 3 results) No results for input(s): BNP in the last 8760 hours. Basic Metabolic Panel: No results for input(s): NA, K, CL, CO2, GLUCOSE, BUN, CREATININE, CALCIUM, MG, PHOS in the last 168 hours. Liver Function Tests: No results for input(s): AST, ALT, ALKPHOS, BILITOT, PROT, ALBUMIN in the last 168 hours. No results for input(s): LIPASE, AMYLASE in the last 168 hours. No results for input(s): AMMONIA in the last 168 hours. CBC: No results for input(s): WBC, NEUTROABS, HGB, HCT, MCV, PLT in the last 168 hours. Cardiac Enzymes: No results for input(s): CKTOTAL, CKMB, CKMBINDEX, TROPONINI in the last 168 hours. BNP: Invalid input(s): POCBNP CBG: No results for input(s): GLUCAP in the last 168 hours. D-Dimer No results for input(s): DDIMER in the last 72 hours. Hgb A1c No results for input(s): HGBA1C in the last 72 hours. Lipid Profile No results  for input(s): CHOL, HDL, LDLCALC, TRIG, CHOLHDL, LDLDIRECT in the last 72 hours. Thyroid function studies No results for input(s): TSH, T4TOTAL, T3FREE, THYROIDAB in the last 72 hours.  Invalid input(s): FREET3 Anemia work up No results for input(s): VITAMINB12, FOLATE, FERRITIN, TIBC, IRON, RETICCTPCT in the last 72 hours. Urinalysis    Component Value Date/Time   COLORURINE YELLOW (A) 06/13/2020 0357   APPEARANCEUR HAZY (A) 06/13/2020 0357   LABSPEC 1.022 06/13/2020 0357   PHURINE 5.0 06/13/2020 0357   GLUCOSEU >=500 (A) 06/13/2020 0357   HGBUR NEGATIVE 06/13/2020 0357   BILIRUBINUR SMALL (A) 06/13/2020 0357   KETONESUR NEGATIVE 06/13/2020 0357   PROTEINUR 30 (A) 06/13/2020 0357   NITRITE POSITIVE (A) 06/13/2020 0357   LEUKOCYTESUR NEGATIVE 06/13/2020 0357   Sepsis Labs Invalid input(s): PROCALCITONIN,  WBC,  LACTICIDVEN Microbiology No results found for this or any previous visit (from the past 240 hour(s)).   Time coordinating discharge: Over 30 minutes  SIGNED:   06/15/2020, DO [facility] 09/30/2020, 9:03 PM   If 7PM-7AM, please contact night-coverage [email or web address]

## 2021-03-18 NOTE — Telephone Encounter (Signed)
Received request for medical records unable to complete as patient has never been seen .    Returning to sender .

## 2021-08-03 IMAGING — DX DG CHEST 1V PORT
1 series · 1 of 1 positions shown · non-contrast
Comparison: Prior radiograph from 06/13/2020.

CLINICAL DATA: Initial evaluation for acute hypoxia, overdose.

EXAM:
PORTABLE CHEST 1 VIEW

[chest ap]
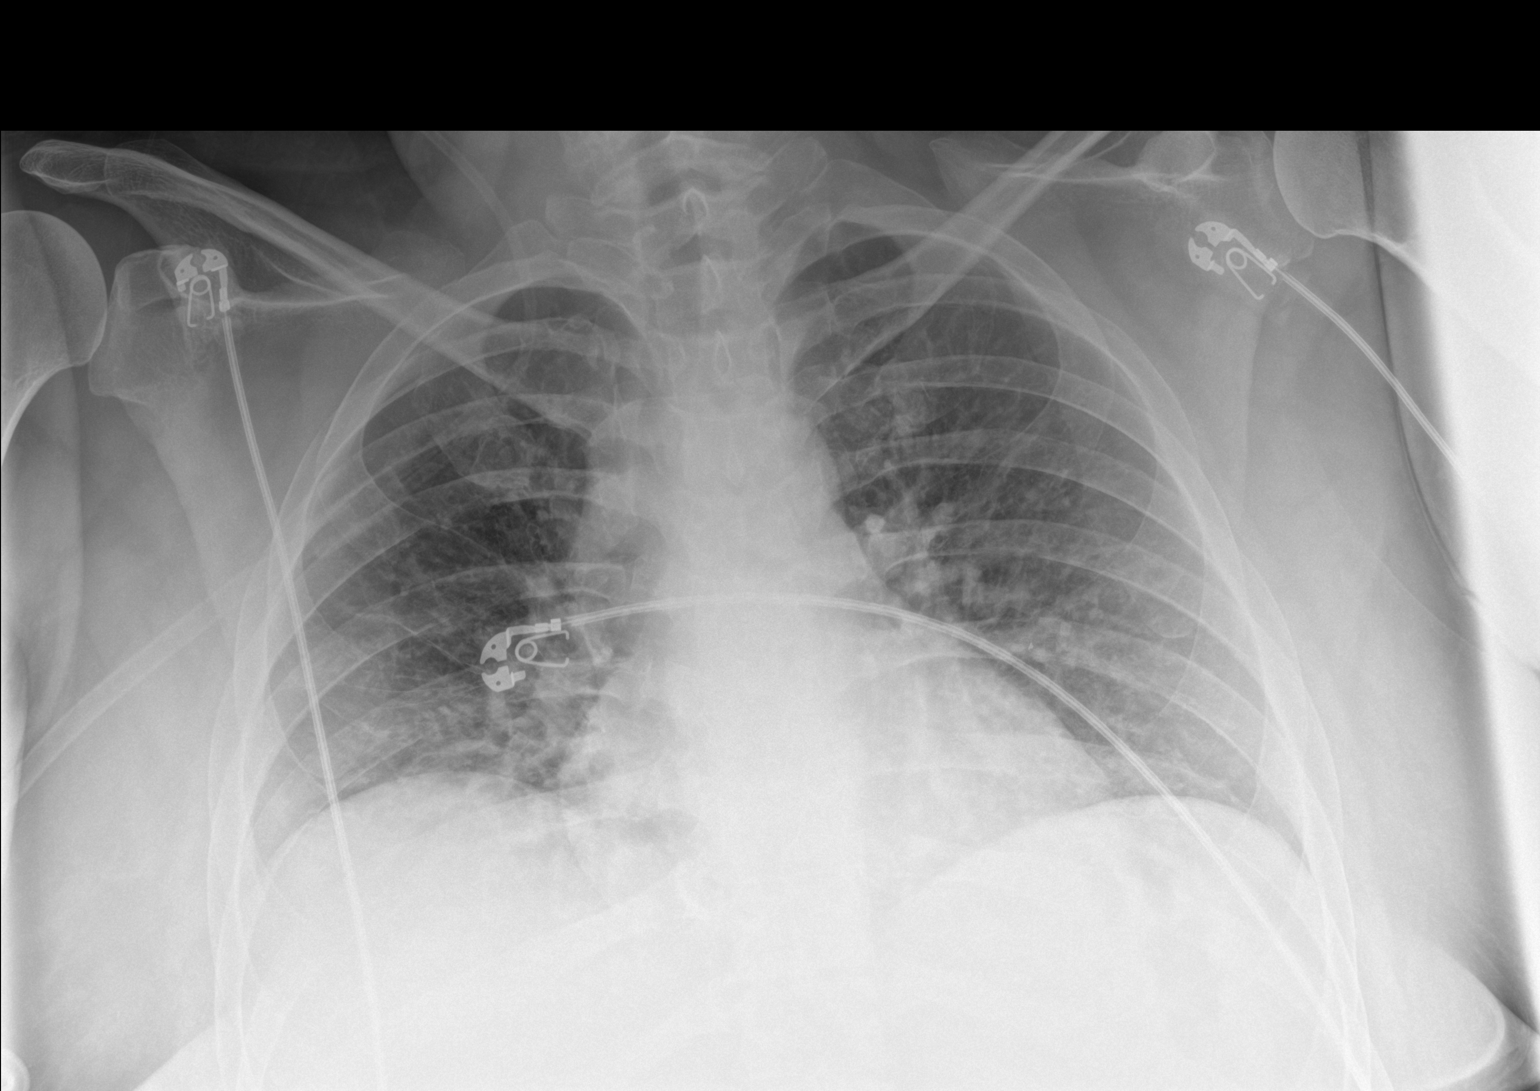

[1 of 1 positions shown; findings below may reference images not displayed]

FINDINGS: Cardiac and mediastinal silhouettes are stable, and remain within
normal limits.

Lungs are hypoinflated. Secondary diffuse bronchovascular crowding
with mild bibasilar atelectasis. No other active cardiopulmonary
disease. No definite focal infiltrates. No edema or effusion. No
pneumothorax.

No acute osseous finding.
IMPRESSION: 1. Shallow lung inflation with secondary diffuse bronchovascular
crowding and mild bibasilar atelectasis.
2. No other active cardiopulmonary disease.

## 2022-08-30 ENCOUNTER — Encounter: Payer: Medicare Other | Admitting: Obstetrics and Gynecology

## 2022-08-30 DIAGNOSIS — N939 Abnormal uterine and vaginal bleeding, unspecified: Secondary | ICD-10-CM

## 2022-08-30 DIAGNOSIS — Z7689 Persons encountering health services in other specified circumstances: Secondary | ICD-10-CM

## 2022-09-26 ENCOUNTER — Encounter: Payer: Medicare Other | Admitting: Medical

## 2022-10-14 ENCOUNTER — Encounter: Payer: Self-pay | Admitting: Obstetrics and Gynecology

## 2023-04-05 ENCOUNTER — Other Ambulatory Visit: Payer: Self-pay | Admitting: Nurse Practitioner

## 2023-04-05 DIAGNOSIS — Z1231 Encounter for screening mammogram for malignant neoplasm of breast: Secondary | ICD-10-CM
